# Patient Record
Sex: Female | Born: 1935 | Race: White | Hispanic: No | Marital: Married | Smoking: Former smoker
Health system: Southern US, Community
[De-identification: ages and names within clinical notes are randomized; demographics above are authoritative.]

## PROBLEM LIST (undated history)

## (undated) DIAGNOSIS — I493 Ventricular premature depolarization: Secondary | ICD-10-CM

## (undated) DIAGNOSIS — R918 Other nonspecific abnormal finding of lung field: Secondary | ICD-10-CM

## (undated) DIAGNOSIS — F329 Major depressive disorder, single episode, unspecified: Secondary | ICD-10-CM

## (undated) DIAGNOSIS — I519 Heart disease, unspecified: Secondary | ICD-10-CM

## (undated) DIAGNOSIS — I482 Chronic atrial fibrillation, unspecified: Secondary | ICD-10-CM

## (undated) DIAGNOSIS — F32A Depression, unspecified: Secondary | ICD-10-CM

## (undated) DIAGNOSIS — K219 Gastro-esophageal reflux disease without esophagitis: Secondary | ICD-10-CM

## (undated) DIAGNOSIS — G459 Transient cerebral ischemic attack, unspecified: Secondary | ICD-10-CM

## (undated) DIAGNOSIS — I429 Cardiomyopathy, unspecified: Secondary | ICD-10-CM

## (undated) DIAGNOSIS — I340 Nonrheumatic mitral (valve) insufficiency: Secondary | ICD-10-CM

## (undated) HISTORY — DX: Chronic atrial fibrillation, unspecified: I48.20

## (undated) HISTORY — DX: Nonrheumatic mitral (valve) insufficiency: I34.0

## (undated) HISTORY — DX: Ventricular premature depolarization: I49.3

## (undated) HISTORY — PX: INTRAOCULAR LENS IMPLANT, SECONDARY: SHX1842

## (undated) HISTORY — DX: Heart disease, unspecified: I51.9

## (undated) HISTORY — DX: Cardiomyopathy, unspecified: I42.9

## (undated) HISTORY — DX: Gastro-esophageal reflux disease without esophagitis: K21.9

## (undated) HISTORY — DX: Depression, unspecified: F32.A

## (undated) HISTORY — DX: Major depressive disorder, single episode, unspecified: F32.9

## (undated) HISTORY — DX: Other nonspecific abnormal finding of lung field: R91.8

## (undated) HISTORY — PX: ABDOMINAL HYSTERECTOMY: SHX81

## (undated) HISTORY — PX: KNEE SURGERY: SHX244

## (undated) HISTORY — PX: APPENDECTOMY: SHX54

---

## 2006-12-20 ENCOUNTER — Encounter: Payer: Self-pay | Admitting: Pulmonary Disease

## 2007-11-11 ENCOUNTER — Encounter: Payer: Self-pay | Admitting: Pulmonary Disease

## 2007-11-29 ENCOUNTER — Ambulatory Visit: Payer: Self-pay | Admitting: Pulmonary Disease

## 2007-11-29 DIAGNOSIS — R05 Cough: Secondary | ICD-10-CM

## 2007-11-29 DIAGNOSIS — I509 Heart failure, unspecified: Secondary | ICD-10-CM | POA: Insufficient documentation

## 2007-12-13 ENCOUNTER — Encounter: Payer: Self-pay | Admitting: Pulmonary Disease

## 2007-12-13 ENCOUNTER — Ambulatory Visit (HOSPITAL_COMMUNITY): Admission: RE | Admit: 2007-12-13 | Discharge: 2007-12-13 | Payer: Self-pay | Admitting: Pulmonary Disease

## 2007-12-20 ENCOUNTER — Ambulatory Visit: Payer: Self-pay | Admitting: Pulmonary Disease

## 2007-12-20 DIAGNOSIS — K219 Gastro-esophageal reflux disease without esophagitis: Secondary | ICD-10-CM

## 2008-01-05 ENCOUNTER — Ambulatory Visit: Payer: Self-pay | Admitting: Internal Medicine

## 2008-01-18 ENCOUNTER — Ambulatory Visit (HOSPITAL_COMMUNITY): Admission: RE | Admit: 2008-01-18 | Discharge: 2008-01-18 | Payer: Self-pay | Admitting: Internal Medicine

## 2008-01-18 ENCOUNTER — Ambulatory Visit: Payer: Self-pay | Admitting: Internal Medicine

## 2008-01-20 ENCOUNTER — Telehealth (INDEPENDENT_AMBULATORY_CARE_PROVIDER_SITE_OTHER): Payer: Self-pay | Admitting: *Deleted

## 2008-04-17 ENCOUNTER — Ambulatory Visit: Payer: Self-pay | Admitting: Gastroenterology

## 2008-08-09 ENCOUNTER — Telehealth (INDEPENDENT_AMBULATORY_CARE_PROVIDER_SITE_OTHER): Payer: Self-pay

## 2008-10-11 ENCOUNTER — Telehealth (INDEPENDENT_AMBULATORY_CARE_PROVIDER_SITE_OTHER): Payer: Self-pay | Admitting: *Deleted

## 2008-11-26 ENCOUNTER — Telehealth (INDEPENDENT_AMBULATORY_CARE_PROVIDER_SITE_OTHER): Payer: Self-pay | Admitting: *Deleted

## 2009-01-28 ENCOUNTER — Encounter (INDEPENDENT_AMBULATORY_CARE_PROVIDER_SITE_OTHER): Payer: Self-pay

## 2010-02-24 ENCOUNTER — Ambulatory Visit: Payer: Self-pay | Admitting: Cardiology

## 2010-02-24 ENCOUNTER — Ambulatory Visit (HOSPITAL_COMMUNITY): Admission: RE | Admit: 2010-02-24 | Discharge: 2010-02-24 | Payer: Self-pay | Admitting: Cardiology

## 2010-05-19 ENCOUNTER — Ambulatory Visit: Payer: Self-pay | Admitting: Cardiology

## 2010-10-28 NOTE — Assessment & Plan Note (Signed)
Casey White, Casey White                  CHART#:  [id number]   DATE:  04/17/2008                       DOB:  January 06, 1928   CHIEF COMPLAINT:  Followup for cough and reflux.   SUBJECTIVE:  The patient is a 77 year old lady who has a history of  typical reflux symptoms with heartburn and regurgitation.  She was  started on Nexium for this.  These symptoms are well controlled.  She  has also history of coughing fits that are particularly worse  nocturnally when she lies down.  At time, she wakes up and feels like  she is choking to death.  This also does occur at times with meals.  She  has had extensive evaluation with a barium pill esophagram, which did  show marked reflux to the cervical, thoracic, and esophagus when she was  supine.  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  A 13-mm barium tablet was passed without  any difficulty.  She also had a speech therapy evaluation.  According to  the patient, she did not have any signs or a risk of aspiration.  She  had an EGD by Dr. Jena Gauss, which showed a single distal esophageal  erosion, patulous EG junction, and small hiatal hernia.  She has been on  Nexium 40 mg b.i.d. now for about 3 months.  She has not noticed any  improvement in her cough and she states it is actually probably worse.  She continued to have some intermittent hoarseness.  She does have a  history of CHF.  She states that Dr. Peter [place], her cardiologist does  not feel that her symptoms are related to this.  She has already been  evaluated by pulmonologist, Dr. Shelle Iron who felt her symptoms were due to  reflux.  She is already sleeping on 3 pillows.  She plans to get a  pillow wedge to try to elevate her more completely.  She is no longer on  metoclopramide that she saw the risk on TV with tardive dyskinesia and  decided to stop it.  She really did not notice any improvement on it  anyhow.   CURRENT MEDICATIONS:  See updated  list.   ALLERGIES:  Macrobid, sulfa, Demerol, codeine, Cipro, and morphine.   PHYSICAL EXAMINATION:  VITAL SIGNS:  Weight 136, down 10 pounds,  temperature 97.7, blood pressure 100/68, and pulse 60.  GENERAL:  Pleasant, well-nourished, and well-developed elderly Caucasian  female, in no acute distress.  SKIN:  Warm and dry.  No jaundice.  HEENT:  Sclerae nonicteric.  Oropharyngeal mucosa moist and pink.  No  lesions, erythema, or exudate.  No lymphadenopathy or thyromegaly.  ABDOMEN:  Soft and nontender.  LOWER EXTREMITIES:  No edema.   IMPRESSION:  The patient is a very pleasant 77 year old lady with  history of chronic coughing spells, which occur primarily nocturnally,  but also throughout the day and sometimes associated with meals.  She  has had a complete evaluation, which revealed marked gastroesophageal  reflux into the cervical, thoracic, and esophagus in the supine position  on a barium esophagram.  She reportedly had an unremarkable speech  therapy evaluation.  EGD findings revealed a single distal esophageal  erosion.  She had a small hiatal hernia.  She has been on double dose  Nexium for over 3 months with no improvement of her cough.  Her typical  reflux symptoms are now completely controlled.  It is unlikely that her  worsening chronic cough is due to acid reflux; however, cannot exclude  other reflux (ie alkaline).  There is an outside chance that her  symptoms are due to laryngopharyngeal reflux, however, I believe other  options do need to be entertained.   PLAN:  I have discussed with the patient and her daughter extensively  today that at this point, I feel that her acid reflux ought to be  adequately controlled on double dose PPI.  We will give her a try with  one other medication prior to any further workup, however.  We will  start on Kapidex 60 mg daily for the next 2 weeks and if she has noticed  no improvement at that time, she will increase to b.i.d.  She  will call  me in few weeks with the progress report and if she continues to have  ongoing cough, we would like for her to go back and see Dr. Shelle Iron prior  to any further workup.  She may ultimately need to see an ENT to verify  that there is nothing in the oropharynx that is stimulating her cough.  An impedence/pH study also may be beneficial.  I have also noticed that  she has dropped about 10 pounds as we initially saw her 5 months ago.  If she continues to have ongoing weight loss, this will need to be  further evaluated as well.       Tana Coast, P.A.  Electronically Signed     R. Roetta Sessions, M.D.  Electronically Signed    LL/MEDQ  D:  04/17/2008  T:  04/17/2008  Job:  [id number]   cc:   Doreen Beam, MD  Barbaraann Share, MD,FCCP

## 2010-10-28 NOTE — Op Note (Signed)
NAME:  Casey White, Casey White                 ACCOUNT NO.:  [id number]   MEDICAL RECORD NO.:  [id number]          PATIENT TYPE:  AMB   LOCATION:  DAY                           FACILITY:  [facility]   PHYSICIAN:  R. Roetta Sessions, M.D. DATE OF BIRTH:  [birth year]   DATE OF PROCEDURE:  01/18/2008  DATE OF DISCHARGE:                               OPERATIVE REPORT   PROCEDURE:  Diagnostic esophagogastroduodenoscopy.   INDICATIONS FOR PROCEDURE:  A 77 year old lady with recent worsening  gastroesophageal reflux disease, refractory symptoms, on Nexium 40 mg  orally daily.  We saw her in office recently and bumped her Nexium up to  40 mg twice daily, and she tells me since that time her reflux symptoms  have improved dramatically.  She is not having any odynophagia or  dysphagia.  She is anticoagulated and Coumadin therapy is ongoing.  EGD  is now being done as a diagnostic-only only study.  Risks, benefits,  alternatives, limitations previously reviewed and again today at the  bedside, questions answered, all parties agreeable.   PROCEDURE NOTE:  O2 saturation, blood pressure, pulse, and respirations  monitored throughout the entire procedure.   CONSCIOUS SEDATION:  Fentanyl 50 mcg IV and Versed 3 mg IV in divided  doses.   INSTRUMENT:  Pentax video chip system.   FINDINGS:  Examination of tubular esophagus revealed a single 6-7-mm  distal esophageal erosion.  EG junction was patulous, easily traversed,  esophageal mucosa otherwise appeared normal.  Stomach:  Gastric cavity  was emptied and insufflated well with air.  Thorough examination of the  gastric mucosa including retroflexion in the proximal stomach and  esophagogastric junction demonstrated hiatal hernia and some benign-  appearing nodular folds of the antrum, pylorus patent, easily traversed.  Examination of the bulb, second portion revealed no abnormalities.  Therapeutic/diagnostic maneuvers performed, none.   The patient tolerated the  procedure well and was reactive to endoscopy.   IMPRESSION:  A single distal esophageal erosion consistent with erosive  reflux esophagitis, patulous esophagogastric junction, small hiatal  hernia, nodular benign-appearing folds of the antrum, otherwise  unremarkable stomach, patent pylorus, and normal D1and D2.   RECOMMENDATIONS:  Continue Nexium 40 mg orally twice daily, prescription  given.  She is urged to go by my office to get some additional samples  to help her out.  Plan to see this nice lady back in the office in 3  months.      Jonathon Bellows, M.D.  Electronically Signed     RMR/MEDQ  D:  01/18/2008  T:  01/19/2008  Job:  [id number]   cc:   Barbaraann Share, MD,FCCP  [address]  [place]  [place] [address]

## 2010-10-28 NOTE — Consult Note (Signed)
NAME:  Casey White, Casey White                 ACCOUNT NO.:  [id number]   MEDICAL RECORD NO.:  [id number]          PATIENT TYPE:  AMB   LOCATION:  DAY                           FACILITY:  [facility]   PHYSICIAN:  R. Roetta Sessions, M.D. DATE OF BIRTH:  [birth year]   DATE OF CONSULTATION:  DATE OF DISCHARGE:                                 CONSULTATION   CHIEF COMPLAINT:  Coughing fits and refractory GERD.   HISTORY OF PRESENT ILLNESS:  Casey White is a 77 year old female.  She  tells me she has been having coughing fits that awaken her at night.  She has been having these symptoms for about 1 year now.  She is  sleeping on 3 pillows as well as a rolled towel.  She has been started  on Nexium 40 mg daily and this was recently increased to b.i.d. about 3  weeks ago.  It does seem to be helping significantly on the b.i.d.  dosing.  She was started on Reglan 10 mg b.i.d. as well about 2 weeks  ago.  She does have heartburn and indigestion prior to taking her  Nexium.  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  She denies any dysphagia.  Denies any  odynophagia.  She denies any nausea or vomiting.  She denies any lower  abdominal pain.  She denies any rectal bleeding or melena.  She has lost  a few pounds, which has been intentional.   PAST MEDICAL AND SURGICAL HISTORY:  She has a history of colonic polyps,  history of atrial fibrillation, has been on Coumadin for about 4 years  now.  She also has congestive heart failure.  She had a CVA in 2001.  She has history of GERD.  She had a colonoscopy about 2 years ago,  believes she had a polyp at that time.  This was done by Dr. Cleotis Nipper  in  [place], [place].  She has history of osteoporosis.  She has had  right foot and ankle surgery.  She is status post hysterectomy, bladder  tack, and left knee surgery.   CURRENT MEDICATIONS:  Nexium 40 mg b.i.d.; metoclopramide 10 mg b.i.d.;  furosemide 40 mg b.i.d.; benzonatate 100 mg 1-2 t.i.d. p.r.n.;  cetirizine nightly; and Coumadin 5 mg on Friday, Saturday, Sunday, and  Monday and 2.5 mg on Tuesday, Wednesday, and Thursday.   ALLERGIES:  MACROBID, SULFA, DEMEROL, CODEINE, CIPRO, and MORPHINE.   Family history is positive for a brother and a father both with colon  cancer diagnosed in their 10s.  Mother deceased at 79 secondary to old  age.  She has lost a sister to ovarian cancer and a brother to lung  cancer and colon cancer.  History is also significant for prostate  cancer, COPD, MI, and diabetes mellitus.  She does have 2 living  brothers.  Total of seven siblings deceased.   SOCIAL HISTORY:  Ms. Nitsch is married.  She has one healthy daughter.  She recently lost her another daughter to suicide approximately 9 months  ago.  She is retired from [employer].  She has a remote history of  tobacco use, quit 40 years ago.  Denies any alcohol or drug use.   REVIEW OF SYSTEMS:  CONSTITUTIONAL:  She is having fatigue and weakness,  otherwise negative review of systems, see HPI.   PHYSICAL EXAM:  VITAL SIGNS:  Weight 146 pounds, height of 66 inches,  temp 97.6, blood pressure 100/80, and pulse 72.  GENERAL:  Casey White is an elderly Caucasian female who is well developed  and well nourished in no acute distress.  HEENT.  Sclerae nonicteric.  Conjunctivae pink.  Oropharynx pink and  moist without any lesions.  NECK:  Supple without mass or thyromegaly.  CHEST:  Heart rate is irregularly irregular.  LUNGS:  Clear to auscultation bilaterally.  ABDOMEN:  Flat with positive bowel sounds x4.  There is no bruits  auscultated.  Abdomen is soft, nontender, and nondistended without   palpable mass or hepatosplenomegaly.  No rebound, tenderness, or  guarding.  EXTREMITIES:  With 1+ ankle edema bilaterally.  SKIN:  Pink, warm, and dry without any rash or jaundice.   IMPRESSION:  Casey White is a 77 year old female with a 1-year history of  refractory gastroesophageal reflux disease.  She continues to have  breakthrough symptoms despite 40 mg of Nexium b.i.d. and Reglan b.i.d.  Her symptoms are suggestive of a LPR with a chronic episodic cough and  nocturnal symptoms.  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  There is no history of  dysphagia.  Given the onset of the symptoms at her age, I do feel she  requires further evaluation with EGD.   She has a history of colonic polyps and strong family history of colon  cancer.  She believes she may be due for surveillance colonoscopy;  however, she is not wanting to pursue this at this time.  She tells me  she had significant nausea and vomiting with the prep.  I have offered  antiemetics, but ultimately she is not wanting to have any further  colonoscopies given her age.  I described this as a relatively low-risk  procedure; however, we will review the records and make further  suggestions.   PLAN:  1. EGD with Dr. Jena Gauss in the near future.  I have discussed the      procedure including risks and benefits, which include but are not      limited to bleeding, infection, perforation, and drug reaction.      Risk of bleeding is increased given the fact that she is on      Coumadin.  I also explained that if she needs manipulation or      biopsies, she may have to have a second EGD for this and she agrees      with this plan.  She and her daughter agree with this plan.  2. Nexium 40 mg b.i.d.  Prescription was given for #60 with 5 refills.      She cannot take omeprazole secondary to potential drug interaction      with Coumadin.  3. I have given her 2-week supply of  samples of Nexium.  4. We will request colonoscopy report from  Dr. Cleotis Nipper.   Thank you Dr. Shelle Iron for allowing [place] to participate in the care of Ms.  White.      Lorenza Burton, N.P.      Jonathon Bellows, M.D.  Electronically Signed    KJ/MEDQ  D:  01/05/2008  T:  01/06/2008  Job:  [id number]   cc:   Barbaraann Share, MD,FCCP  [address]  [place]  [place] [address]

## 2010-11-21 ENCOUNTER — Encounter: Payer: Self-pay | Admitting: Cardiology

## 2010-11-21 ENCOUNTER — Other Ambulatory Visit: Payer: Self-pay | Admitting: *Deleted

## 2010-11-21 DIAGNOSIS — R0602 Shortness of breath: Secondary | ICD-10-CM

## 2010-11-24 ENCOUNTER — Other Ambulatory Visit (INDEPENDENT_AMBULATORY_CARE_PROVIDER_SITE_OTHER): Payer: Medicare Other | Admitting: *Deleted

## 2010-11-24 ENCOUNTER — Encounter: Payer: Self-pay | Admitting: Cardiology

## 2010-11-24 ENCOUNTER — Ambulatory Visit (INDEPENDENT_AMBULATORY_CARE_PROVIDER_SITE_OTHER): Payer: Medicare Other | Admitting: Cardiology

## 2010-11-24 VITALS — BP 110/70 | HR 76 | Ht 63.5 in | Wt 139.4 lb

## 2010-11-24 DIAGNOSIS — I509 Heart failure, unspecified: Secondary | ICD-10-CM

## 2010-11-24 DIAGNOSIS — R0602 Shortness of breath: Secondary | ICD-10-CM

## 2010-11-24 DIAGNOSIS — I482 Chronic atrial fibrillation, unspecified: Secondary | ICD-10-CM

## 2010-11-24 DIAGNOSIS — I4891 Unspecified atrial fibrillation: Secondary | ICD-10-CM

## 2010-11-24 LAB — BRAIN NATRIURETIC PEPTIDE: Pro B Natriuretic peptide (BNP): 401 pg/mL — ABNORMAL HIGH (ref 0.0–100.0)

## 2010-11-24 LAB — BASIC METABOLIC PANEL
CO2: 29 mEq/L (ref 19–32)
Calcium: 9.1 mg/dL (ref 8.4–10.5)
Creatinine, Ser: 0.9 mg/dL (ref 0.4–1.2)

## 2010-11-24 MED ORDER — METOPROLOL SUCCINATE ER 50 MG PO TB24: 50.0000 mg | ORAL_TABLET | Freq: Every day | ORAL | Status: DC

## 2010-11-24 NOTE — Assessment & Plan Note (Signed)
Rate is well controlled on current medications. She is on chronic anticoagulation with Coumadin. We will continue with her current medical therapy.

## 2010-11-24 NOTE — Assessment & Plan Note (Addendum)
She is well compensated on her current diuretic therapy. I recommended continuing on her same therapy. She is down 7 pounds from her last visit. We will continue her on metoprolol and Lasix. I've instructed her in sodium restriction. We will check a basic metabolic panel and BNP level today. I will follow up again in 6 months.

## 2010-11-24 NOTE — Patient Instructions (Signed)
We will call you with the results of your blood work today.  Avoid salt intake.  We will see you again in 6 months.

## 2010-11-24 NOTE — Progress Notes (Signed)
   Casey White Date of Birth: 12-Jan-1928   History of Present Illness: Casey White is seen today for followup. She states she has been doing well. She denies any chest pain or shortness of breath. She does complain of feeling weak. She's had no significant headache, dizziness, or syncope. She does report that her husband who is 85 and is in failing health. She has had no edema or orthopnea.  Current Outpatient Prescriptions on File Prior to Visit  Medication Sig Dispense Refill  . ALPRAZolam (XANAX) 0.25 MG tablet Take 0.25 mg by mouth at bedtime as needed.        . calcium carbonate (OS-CAL - DOSED IN MG OF ELEMENTAL CALCIUM) 1250 MG tablet Take 1 tablet by mouth daily.        . furosemide (LASIX) 20 MG tablet Take 60 mg by mouth 2 (two) times daily.        . mirtazapine (REMERON) 15 MG tablet Take 7.5 mg by mouth at bedtime.       . traMADol-acetaminophen (ULTRACET) 37.5-325 MG per tablet Take 1 tablet by mouth every 6 (six) hours as needed.        . warfarin (COUMADIN) 5 MG tablet Take 5 mg by mouth as directed.        Marland Kitchen DISCONTD: metoprolol (TOPROL-XL) 50 MG 24 hr tablet Take 50 mg by mouth daily.          Allergies  Allergen Reactions  . Ciprofloxacin   . Codeine   . Meperidine Hcl   . Morphine   . Nitrofurantoin   . Sulfonamide Derivatives     Past Medical History  Diagnosis Date  . CHF (congestive heart failure)     WITH MODERATE SYSTOLIC DYSFUNCTION. EF 35-45%  . Chronic atrial fibrillation   . Chronic anticoagulation   . GERD (gastroesophageal reflux disease)   . Depression   . PVCs (premature ventricular contractions)     Past Surgical History  Procedure Date  . Appendectomy   . Knee surgery     LEFT KNEE  . Abdominal hysterectomy   . Intraocular lens implant, secondary     bilateral    History  Smoking status  . Former Smoker  . Quit date: 11/20/1965  Smokeless tobacco  . Not on file    History  Alcohol Use No    Family History  Problem  Relation Age of Onset  . Colon cancer Father     Review of Systems: As noted in history of present illness.  All other systems were reviewed and are negative.  Physical Exam: BP 110/70  Pulse 76  Ht 5' 3.5" (1.613 m)  Wt 139 lb 6.4 oz (63.231 kg)  BMI 24.31 kg/m2 She is a pleasant elderly white female in no acute distress. Her HEENT exam is unremarkable. She has no JVD or bruits. Lungs are clear. Cardiac exam reveals an irregular rate and rhythm without gallop, murmur, or click. Abdomen is soft and nontender without masses or bruits. Extremities are without edema. Pedal pulses are 2+ and symmetric. LABORATORY DATA:   Assessment / Plan:

## 2010-11-25 ENCOUNTER — Telehealth: Payer: Self-pay | Admitting: *Deleted

## 2010-11-25 NOTE — Telephone Encounter (Signed)
Message copied by Lorayne Bender on Tue Nov 25, 2010 11:28 AM ------      Message from: [place], PETER M      Created: Mon Nov 24, 2010  3:05 PM       Bmet is normal. BNP has increased a little but is stable. Continue current diuretic Rx.

## 2010-11-25 NOTE — Telephone Encounter (Signed)
Notified of lab results. Will send copy to Dr. Sherril Croon

## 2011-05-14 ENCOUNTER — Telehealth: Payer: Self-pay | Admitting: Cardiology

## 2011-05-14 NOTE — Telephone Encounter (Signed)
Error

## 2011-05-21 ENCOUNTER — Telehealth: Payer: Self-pay | Admitting: *Deleted

## 2011-05-21 NOTE — Telephone Encounter (Signed)
Error

## 2011-05-28 ENCOUNTER — Ambulatory Visit: Payer: PRIVATE HEALTH INSURANCE | Admitting: Cardiology

## 2011-06-18 ENCOUNTER — Encounter: Payer: Self-pay | Admitting: Cardiology

## 2011-06-18 ENCOUNTER — Ambulatory Visit (INDEPENDENT_AMBULATORY_CARE_PROVIDER_SITE_OTHER): Payer: Medicare Other | Admitting: Cardiology

## 2011-06-18 VITALS — BP 112/80 | HR 74 | Ht 67.0 in | Wt 137.8 lb

## 2011-06-18 DIAGNOSIS — I509 Heart failure, unspecified: Secondary | ICD-10-CM

## 2011-06-18 DIAGNOSIS — I482 Chronic atrial fibrillation, unspecified: Secondary | ICD-10-CM

## 2011-06-18 DIAGNOSIS — I4891 Unspecified atrial fibrillation: Secondary | ICD-10-CM

## 2011-06-18 NOTE — Progress Notes (Signed)
   Casey White Date of Birth: Jun 18, 1927   History of Present Illness: Casey White is seen today for 6 month followup. She states she has been doing well. She denies any chest pain. Sometimes at night she gets short of breath if she lies on her right side. It resolves if she rolls to her left side. She has noted some mild edema at times. She denies any shortness of breath with exertion.  Current Outpatient Prescriptions on File Prior to Visit  Medication Sig Dispense Refill  . ALPRAZolam (XANAX) 0.25 MG tablet Take 0.25 mg by mouth at bedtime as needed.        . calcium carbonate (OS-CAL - DOSED IN MG OF ELEMENTAL CALCIUM) 1250 MG tablet Take 1 tablet by mouth daily.        . furosemide (LASIX) 20 MG tablet Take 60 mg by mouth 2 (two) times daily.        . metoprolol (TOPROL-XL) 50 MG 24 hr tablet Take 1 tablet (50 mg total) by mouth daily.  90 tablet  3  . mirtazapine (REMERON) 15 MG tablet Take 7.5 mg by mouth at bedtime.       . traMADol-acetaminophen (ULTRACET) 37.5-325 MG per tablet Take 1 tablet by mouth every 6 (six) hours as needed.        . warfarin (COUMADIN) 5 MG tablet Take 5 mg by mouth as directed.          Allergies  Allergen Reactions  . Angiotensin Receptor Blockers Other (See Comments)    hypotension  . Ciprofloxacin   . Codeine   . Lisinopril Cough  . Meperidine Hcl   . Morphine   . Nitrofurantoin   . Sulfonamide Derivatives     Past Medical History  Diagnosis Date  . CHF (congestive heart failure)     WITH MODERATE SYSTOLIC DYSFUNCTION. EF 35-45%  . Chronic atrial fibrillation   . Chronic anticoagulation   . GERD (gastroesophageal reflux disease)   . Depression   . PVCs (premature ventricular contractions)     Past Surgical History  Procedure Date  . Appendectomy   . Knee surgery     LEFT KNEE  . Abdominal hysterectomy   . Intraocular lens implant, secondary     bilateral    History  Smoking status  . Former Smoker  . Quit date: 11/20/1965    Smokeless tobacco  . Not on file    History  Alcohol Use No    Family History  Problem Relation Age of Onset  . Colon cancer Father     Review of Systems: As noted in history of present illness.  All other systems were reviewed and are negative.  Physical Exam: BP 112/80  Pulse 74  Ht 5\' 7"  (1.702 m)  Wt 137 lb 12.8 oz (62.506 kg)  BMI 21.58 kg/m2 She is a pleasant elderly white female in no acute distress. Her HEENT exam is unremarkable. She has no JVD or bruits. Lungs are clear. Cardiac exam reveals an irregular rate and rhythm without gallop, murmur, or click. Abdomen is soft and nontender without masses or bruits. Extremities are without edema. Pedal pulses are 2+ and symmetric. LABORATORY DATA: ECG today demonstrates atrial fibrillation with controlled ventricular response of 74 beats per minute. She has occasional PVCs. There is a nonspecific ST abnormality and mildly prolonged QT interval.  Assessment / Plan:

## 2011-06-18 NOTE — Assessment & Plan Note (Signed)
Her heart rate is well controlled on beta blocker therapy. She is on chronic anticoagulation with Coumadin. This is monitored by Dr. Sherril Croon.

## 2011-06-18 NOTE — Progress Notes (Signed)
Addended by: Erin Hearing on: 06/18/2011 04:35 PM   Modules accepted: Orders

## 2011-06-18 NOTE — Patient Instructions (Signed)
Continue your current medications  I will see you again in 6 months.   

## 2011-06-18 NOTE — Assessment & Plan Note (Signed)
She has a history of moderate left ventricular dysfunction. She appears to be well compensated on exam today. She is on diuretic therapy and a beta blocker. She has been intolerant of ACE inhibitors in the past because of cough. ARB use of resulted in significant hypotension. We will continue with her current therapy. I've reinforced the need for sodium restriction. I'll followup again in 6 months.

## 2011-07-03 NOTE — Progress Notes (Signed)
Addended by: Judithe Modest D on: 07/03/2011 01:56 PM   Modules accepted: Orders

## 2012-01-11 ENCOUNTER — Ambulatory Visit: Payer: Medicare Other | Admitting: Cardiology

## 2012-01-24 ENCOUNTER — Encounter (HOSPITAL_COMMUNITY): Payer: Self-pay | Admitting: Internal Medicine

## 2012-01-24 ENCOUNTER — Inpatient Hospital Stay (HOSPITAL_COMMUNITY)
Admission: EM | Admit: 2012-01-24 | Discharge: 2012-01-27 | DRG: 066 | Disposition: A | Discharge status: Skilled Nursing Facility | Payer: Medicare Other | Attending: Internal Medicine | Admitting: Internal Medicine

## 2012-01-24 ENCOUNTER — Emergency Department (HOSPITAL_COMMUNITY): Payer: Medicare Other

## 2012-01-24 DIAGNOSIS — R9389 Abnormal findings on diagnostic imaging of other specified body structures: Secondary | ICD-10-CM

## 2012-01-24 DIAGNOSIS — E119 Type 2 diabetes mellitus without complications: Secondary | ICD-10-CM | POA: Diagnosis present

## 2012-01-24 DIAGNOSIS — Z7901 Long term (current) use of anticoagulants: Secondary | ICD-10-CM

## 2012-01-24 DIAGNOSIS — I4891 Unspecified atrial fibrillation: Secondary | ICD-10-CM | POA: Diagnosis present

## 2012-01-24 DIAGNOSIS — Z8673 Personal history of transient ischemic attack (TIA), and cerebral infarction without residual deficits: Secondary | ICD-10-CM

## 2012-01-24 DIAGNOSIS — I6340 Cerebral infarction due to embolism of unspecified cerebral artery: Secondary | ICD-10-CM

## 2012-01-24 DIAGNOSIS — I6350 Cerebral infarction due to unspecified occlusion or stenosis of unspecified cerebral artery: Principal | ICD-10-CM | POA: Diagnosis present

## 2012-01-24 DIAGNOSIS — I639 Cerebral infarction, unspecified: Secondary | ICD-10-CM

## 2012-01-24 DIAGNOSIS — R4701 Aphasia: Secondary | ICD-10-CM | POA: Diagnosis present

## 2012-01-24 DIAGNOSIS — R4789 Other speech disturbances: Secondary | ICD-10-CM | POA: Diagnosis present

## 2012-01-24 DIAGNOSIS — E876 Hypokalemia: Secondary | ICD-10-CM | POA: Diagnosis present

## 2012-01-24 HISTORY — DX: Transient cerebral ischemic attack, unspecified: G45.9

## 2012-01-24 LAB — POCT I-STAT, CHEM 8
Calcium, Ion: 1.04 mmol/L — ABNORMAL LOW (ref 1.13–1.30)
Chloride: 101 mEq/L (ref 96–112)
Creatinine, Ser: 1.2 mg/dL — ABNORMAL HIGH (ref 0.50–1.10)
Glucose, Bld: 107 mg/dL — ABNORMAL HIGH (ref 70–99)
HCT: 44 % (ref 36.0–46.0)
Hemoglobin: 15 g/dL (ref 12.0–15.0)

## 2012-01-24 LAB — COMPREHENSIVE METABOLIC PANEL
Albumin: 3.6 g/dL (ref 3.5–5.2)
BUN: 18 mg/dL (ref 6–23)
Creatinine, Ser: 1 mg/dL (ref 0.50–1.10)
GFR calc Af Amer: 55 mL/min — ABNORMAL LOW (ref >90)
Total Protein: 7.7 g/dL (ref 6.0–8.3)

## 2012-01-24 LAB — CBC
HCT: 42.3 % (ref 36.0–46.0)
Hemoglobin: 13.7 g/dL (ref 12.0–15.0)
MCH: 27.4 pg (ref 26.0–34.0)
MCHC: 32.4 g/dL (ref 30.0–36.0)
MCV: 84.6 fL (ref 78.0–100.0)

## 2012-01-24 LAB — URINALYSIS, ROUTINE W REFLEX MICROSCOPIC
Bilirubin Urine: NEGATIVE
Ketones, ur: NEGATIVE mg/dL
Nitrite: NEGATIVE
pH: 7 (ref 5.0–8.0)

## 2012-01-24 LAB — DIFFERENTIAL
Basophils Relative: 1 % (ref 0–1)
Eosinophils Absolute: 0.3 10*3/uL (ref 0.0–0.7)
Eosinophils Relative: 2 % (ref 0–5)
Monocytes Absolute: 0.9 10*3/uL (ref 0.1–1.0)
Monocytes Relative: 8 % (ref 3–12)

## 2012-01-24 LAB — CK TOTAL AND CKMB (NOT AT ARMC)
CK, MB: 2.1 ng/mL (ref 0.3–4.0)
Total CK: 105 U/L (ref 7–177)

## 2012-01-24 LAB — GLUCOSE, CAPILLARY: Glucose-Capillary: 107 mg/dL — ABNORMAL HIGH (ref 70–99)

## 2012-01-24 LAB — URINE MICROSCOPIC-ADD ON

## 2012-01-24 LAB — TROPONIN I: Troponin I: <0.3 ng/mL (ref <0.30)

## 2012-01-24 LAB — PROTIME-INR: INR: 2.21 — ABNORMAL HIGH (ref 0.00–1.49)

## 2012-01-24 NOTE — Code Documentation (Addendum)
78 yo wf brought in via [place] [facility] with acute onset aphasia.  Pt drove herself this afternoon to the nursing home to visit her husband and had a witnessed acute onset on aphasia.  Pt has a hx of A. Fib &takes coumadin per her family. Code stroke called 1945, pt arrival 14, LSN 76, pt arrival in CT 1952, CT read 2003. [facility] 4 unable to answer questions & expressive aphasia.  Code stroke cancelled at 2118 by Dr. Roseanne Reno. Pt not a tPA candidate.

## 2012-01-24 NOTE — Code Documentation (Signed)
The pt has h  A history of af and small strokes in the past according to the granddaughter who remains at the bedside.Marland Kitchen

## 2012-01-24 NOTE — ED Notes (Signed)
Escorted family member back to see patient; RN aware. 

## 2012-01-24 NOTE — Code Documentation (Signed)
The pt is conversing with her granddaughter.  The pts speech and thought process is clear at present.

## 2012-01-24 NOTE — ED Provider Notes (Signed)
I saw and evaluated the patient, reviewed the resident's note and I agree with the findings and plan.  Acute onset of expressive aphasia, slow to follow commands, no apparent motor deficits. Not tPA candidate based on improving symptoms, age, history of coumadin use.  Glynn Octave, MD 01/24/12 [phone]

## 2012-01-24 NOTE — ED Notes (Signed)
The pt passed her swallow screen family at the bedside

## 2012-01-24 NOTE — Code Documentation (Signed)
Family at beside. Family given emotional support. 

## 2012-01-24 NOTE — Code Documentation (Signed)
The pt placed on the bedpan unable to void.  Asking for water.  Swallow screen not performed just yet

## 2012-01-24 NOTE — H&P (Signed)
Casey White is an 78 y.o. female.   Patient was seen and examined on January 24, 2012 at 10:45 PM. PCP - Dr. Sherril Croon. Chief Complaint: Difficulty bringing out words. HPI: 78 year-old female with history of atrial fibrillation on Coumadin and previous history of TIA had gone to see her husband had nursing home at around 6:45 PM when she suddenly developed speech difficulties when she was found to have difficulty breathing out words as witnessed by patient's daughter who was by her side. She also felt dizzy but did not lose consciousness. She was brought to the ER. CT head was showing subacute to chronic basal ganglia infarct. Neurologist on-call Dr. Roseanne Reno had already seen the patient and at this time feels patient is not a candidate for TPA as patient is on a Coumadin which is therapeutic. Patient will be admitted for further workup. Patient still has mild expressive aphasia. Denies any chest pain shortness of breath palpitation nausea vomiting headache or visual symptoms.  Past Medical History  Diagnosis Date  . TIA (transient ischemic attack)   . Dysrhythmia     Past Surgical History  Procedure Date  . Abdominal hysterectomy   . Appendectomy     History reviewed. No pertinent family history. Social History:  reports that she has never smoked. She does not have any smokeless tobacco history on file. She reports that she does not drink alcohol or use illicit drugs.  Allergies:  Allergies  Allergen Reactions  . Amoxicillin Other (See Comments)    unknown  . Codeine Nausea And Vomiting  . Demerol (Meperidine) Nausea And Vomiting     (Not in a hospital admission)  Results for orders placed during the hospital encounter of 01/24/12 (from the past 48 hour(s))  PROTIME-INR     Status: Abnormal   Collection Time   01/24/12  8:04 PM      Component Value Range Comment   Prothrombin Time 24.9 (*) 11.6 - 15.2 seconds    INR 2.21 (*) 0.00 - 1.49   APTT     Status: Abnormal   Collection Time     01/24/12  8:04 PM      Component Value Range Comment   aPTT 39 (*) 24 - 37 seconds   CBC     Status: Abnormal   Collection Time   01/24/12  8:04 PM      Component Value Range Comment   WBC 12.1 (*) 4.0 - 10.5 K/uL    RBC 5.00  3.87 - 5.11 MIL/uL    Hemoglobin 13.7  12.0 - 15.0 g/dL    HCT 16.1  09.6 - 04.5 %    MCV 84.6  78.0 - 100.0 fL    MCH 27.4  26.0 - 34.0 pg    MCHC 32.4  30.0 - 36.0 g/dL    RDW 40.9  81.1 - 91.4 %    Platelets 398  150 - 400 K/uL   DIFFERENTIAL     Status: Abnormal   Collection Time   01/24/12  8:04 PM      Component Value Range Comment   Neutrophils Relative 33 (*) 43 - 77 %    Neutro Abs 4.0  1.7 - 7.7 K/uL    Lymphocytes Relative 57 (*) 12 - 46 %    Lymphs Abs 6.8 (*) 0.7 - 4.0 K/uL    Monocytes Relative 8  3 - 12 %    Monocytes Absolute 0.9  0.1 - 1.0 K/uL    Eosinophils Relative 2  0 - 5 %    Eosinophils Absolute 0.3  0.0 - 0.7 K/uL    Basophils Relative 1  0 - 1 %    Basophils Absolute 0.1  0.0 - 0.1 K/uL   COMPREHENSIVE METABOLIC PANEL     Status: Abnormal   Collection Time   01/24/12  8:04 PM      Component Value Range Comment   Sodium 139  135 - 145 mEq/L    Potassium 3.1 (*) 3.5 - 5.1 mEq/L    Chloride 99  96 - 112 mEq/L    CO2 29  19 - 32 mEq/L    Glucose, Bld 109 (*) 70 - 99 mg/dL    BUN 18  6 - 23 mg/dL    Creatinine, Ser 1.61  0.50 - 1.10 mg/dL    Calcium 9.1  8.4 - 09.6 mg/dL    Total Protein 7.7  6.0 - 8.3 g/dL    Albumin 3.6  3.5 - 5.2 g/dL    AST 28  0 - 37 U/L    ALT 12  0 - 35 U/L    Alkaline Phosphatase 61  39 - 117 U/L    Total Bilirubin 0.4  0.3 - 1.2 mg/dL    GFR calc non Af Amer 47 (*) >90 mL/min    GFR calc Af Amer 55 (*) >90 mL/min   CK TOTAL AND CKMB     Status: Normal   Collection Time   01/24/12  8:05 PM      Component Value Range Comment   Total CK 105  7 - 177 U/L    CK, MB 2.1  0.3 - 4.0 ng/mL    Relative Index 2.0  0.0 - 2.5   TROPONIN I     Status: Normal   Collection Time   01/24/12  8:05 PM       Component Value Range Comment   Troponin I <0.30  <0.30 ng/mL   GLUCOSE, CAPILLARY     Status: Abnormal   Collection Time   01/24/12  8:23 PM      Component Value Range Comment   Glucose-Capillary 107 (*) 70 - 99 mg/dL    Comment 1 Notify RN     POCT I-STAT, CHEM 8     Status: Abnormal   Collection Time   01/24/12  8:23 PM      Component Value Range Comment   Sodium 142  135 - 145 mEq/L    Potassium 3.2 (*) 3.5 - 5.1 mEq/L    Chloride 101  96 - 112 mEq/L    BUN 19  6 - 23 mg/dL    Creatinine, Ser 0.45 (*) 0.50 - 1.10 mg/dL    Glucose, Bld 409 (*) 70 - 99 mg/dL    Calcium, Ion 8.11 (*) 1.13 - 1.30 mmol/L    TCO2 29  0 - 100 mmol/L    Hemoglobin 15.0  12.0 - 15.0 g/dL    HCT 91.4  78.2 - 95.6 %   URINALYSIS, ROUTINE W REFLEX MICROSCOPIC     Status: Abnormal   Collection Time   01/24/12  9:47 PM      Component Value Range Comment   Color, Urine YELLOW  YELLOW    APPearance CLEAR  CLEAR    Specific Gravity, Urine 1.008  1.005 - 1.030    pH 7.0  5.0 - 8.0    Glucose, UA NEGATIVE  NEGATIVE mg/dL    Hgb urine dipstick TRACE (*) NEGATIVE  Bilirubin Urine NEGATIVE  NEGATIVE    Ketones, ur NEGATIVE  NEGATIVE mg/dL    Protein, ur NEGATIVE  NEGATIVE mg/dL    Urobilinogen, UA 1.0  0.0 - 1.0 mg/dL    Nitrite NEGATIVE  NEGATIVE    Leukocytes, UA SMALL (*) NEGATIVE   URINE MICROSCOPIC-ADD ON     Status: Normal   Collection Time   01/24/12  9:47 PM      Component Value Range Comment   Squamous Epithelial / LPF RARE  RARE    WBC, UA 0-2  <3 WBC/hpf    RBC / HPF 0-2  <3 RBC/hpf    Bacteria, UA RARE  RARE    Ct Head Wo Contrast  01/24/2012  *RADIOLOGY REPORT*  Clinical Data: Weakness, confusion  CT HEAD WITHOUT CONTRAST  Technique:  Contiguous axial images were obtained from the base of the skull through the vertex without contrast.  Comparison: None.  Findings: Focal hypoattenuation in the right basal ganglia may represent subacute infarct. Diffuse parenchymal atrophy. Patchy areas of  hypoattenuation in deep and periventricular white matter bilaterally. Negative for acute intracranial hemorrhage, mass lesion, acute infarction, midline shift, or mass-effect. Acute infarct may be inapparent on noncontrast CT. Ventricles and sulci symmetric. Bone windows demonstrate no focal lesion.  IMPRESSION:  1.  Focal hypoattenuation in the right basal ganglia may represent subacute or chronic non hemorrhagic lacunar type infarct. I telephoned the critical test results to Dr. Manus Gunning at the time of interpretation.  2. Atrophy and nonspecific white matter changes.  Original Report Authenticated By: Osa Craver, M.D.    Review of Systems  Constitutional: Negative.   HENT: Negative.   Eyes: Negative.   Respiratory: Negative.   Cardiovascular: Negative.   Gastrointestinal: Negative.   Genitourinary: Negative.   Musculoskeletal: Negative.   Skin: Negative.   Neurological: Positive for speech change.  Endo/Heme/Allergies: Negative.   Psychiatric/Behavioral: Negative.     Blood pressure 121/65, pulse 88, temperature 98 F (36.7 C), temperature source Oral, resp. rate 20, SpO2 100.00%. Physical Exam  Constitutional: She is oriented to person, place, and time. She appears well-developed and well-nourished. No distress.  HENT:  Head: Normocephalic and atraumatic.  Right Ear: External ear normal.  Left Ear: External ear normal.  Nose: Nose normal.  Mouth/Throat: Oropharynx is clear and moist. No oropharyngeal exudate.  Eyes: Conjunctivae are normal. Pupils are equal, round, and reactive to light. Right eye exhibits no discharge. Left eye exhibits no discharge. No scleral icterus.  Neck: Normal range of motion. Neck supple.  Cardiovascular: Normal rate.        Irregular rhythm.  Respiratory: Effort normal and breath sounds normal. No respiratory distress. She has no wheezes. She has no rales.  GI: Soft. Bowel sounds are normal. She exhibits no distension. There is no tenderness.  There is no rebound.  Musculoskeletal: Normal range of motion. She exhibits no edema and no tenderness.  Neurological: She is alert and oriented to person, place, and time.       Moves all extremities 5/5. No facial asymmetry. Tongue midline.  Skin: Skin is warm and dry. She is not diaphoretic.  Psychiatric: Her behavior is normal.     Assessment/Plan #1. Expressive aphasia - given patient's history of previous TIA and atrial fibrillation at this time symptoms are concerning for CVA. MRI/MRA brain, carotid Doppler and 2-D echo has been ordered. #2. Atrial fibrillation rate controlled - Coumadin per pharmacy.  Patient's medication reconciliation is to to be completed. Have instructed  nurses to cause once done.  CODE STATUS - full code.  Dorethy Tomey N. 01/24/2012, 11:02 PM

## 2012-01-24 NOTE — ED Notes (Signed)
The pt arrived by [facility] pt was visiting her husband at a nursing home in [place] and she started having speech difficulty and rt sided waekness at approx 1846.  Code stroke called at  enroute by ems approx 1850.  Pt alert on arrival no iv.  Speech clearing according to ems equal grips.  Difficulty answering questions with hesitation.  The speech and her ability to answer questions copmes and goes.

## 2012-01-24 NOTE — ED Notes (Signed)
Family member in waiting room waiting to see patient; informed family member to check back with RN in 15 minutes in order to give time for RN and MD assessment.  Family member verbalized understanding; will continue to monitor.

## 2012-01-24 NOTE — ED Notes (Signed)
Admitting doctor here to see 

## 2012-01-24 NOTE — ED Provider Notes (Signed)
History     CSN: [id number]  Arrival date & time 01/24/12  1951   First MD Initiated Contact with Patient 01/24/12 1952      No chief complaint on file.   (Consider location/radiation/quality/duration/timing/severity/associated sxs/prior treatment) Patient is a 78 y.o. female presenting with neurologic complaint. The history is provided by the EMS personnel and a relative.  Neurologic Problem The primary symptoms include speech change. Primary symptoms do not include headaches, seizures, fever, nausea or vomiting. The symptoms began 1 to 2 hours ago. The symptoms are improving. The neurological symptoms are focal. Context: while visiting husband in nursing home.  Change in speech began 1 - 3 hours ago. The speech change is improving. Features of the speech change include inability to articulate and inability to speak fluently.  Additional symptoms do not include weakness or pain. Medical issues do not include cerebral vascular accident.    No past medical history on file.  No past surgical history on file.  No family history on file.  History  Substance Use Topics  . Smoking status: Not on file  . Smokeless tobacco: Not on file  . Alcohol Use: Not on file    OB History    No data available      Review of Systems  Constitutional: Negative.  Negative for fever.  HENT: Negative for neck pain.   Eyes: Negative for visual disturbance.  Respiratory: Negative for shortness of breath.   Cardiovascular: Negative for chest pain and leg swelling.  Gastrointestinal: Negative for nausea, vomiting, abdominal pain and constipation.  Genitourinary: Negative for urgency, decreased urine volume and difficulty urinating.  Musculoskeletal: Negative.   Skin: Negative.   Neurological: Positive for speech change and speech difficulty. Negative for seizures, syncope, weakness and headaches.  Psychiatric/Behavioral: Negative.   All other systems reviewed and are negative.    Allergies    Review of patient's allergies indicates not on file.  Home Medications  No current outpatient prescriptions on file.  There were no vitals taken for this visit.  Physical Exam  Nursing note and vitals reviewed. Constitutional: She is oriented to person, place, and time. She appears well-developed and well-nourished. No distress.  HENT:  Head: Normocephalic and atraumatic.  Right Ear: External ear normal.  Left Ear: External ear normal.  Nose: Nose normal.  Mouth/Throat: Oropharynx is clear and moist.  Eyes: EOM are normal. Pupils are equal, round, and reactive to light.  Cardiovascular: Normal rate, regular rhythm, normal heart sounds and intact distal pulses.   Pulmonary/Chest: Effort normal and breath sounds normal. No respiratory distress. She has no wheezes. She has no rales.  Abdominal: Soft. She exhibits no distension. There is no tenderness.  Neurological: She is alert and oriented to person, place, and time. She has normal strength. No sensory deficit. She exhibits normal muscle tone.       Equal strength in all 4 extremities  Skin: Skin is warm and dry. She is not diaphoretic.    ED Course  Procedures (including critical care time)  Labs Reviewed  PROTIME-INR - Abnormal; Notable for the following:    Prothrombin Time 24.9 (*)     INR 2.21 (*)     All other components within normal limits  APTT - Abnormal; Notable for the following:    aPTT 39 (*)     All other components within normal limits  CBC - Abnormal; Notable for the following:    WBC 12.1 (*)     All other components within normal  limits  DIFFERENTIAL - Abnormal; Notable for the following:    Neutrophils Relative 33 (*)     Lymphocytes Relative 57 (*)     Lymphs Abs 6.8 (*)     All other components within normal limits  COMPREHENSIVE METABOLIC PANEL - Abnormal; Notable for the following:    Potassium 3.1 (*)     Glucose, Bld 109 (*)     GFR calc non Af Amer 47 (*)     GFR calc Af Amer 55 (*)     All  other components within normal limits  GLUCOSE, CAPILLARY - Abnormal; Notable for the following:    Glucose-Capillary 107 (*)     All other components within normal limits  POCT I-STAT, CHEM 8 - Abnormal; Notable for the following:    Potassium 3.2 (*)     Creatinine, Ser 1.20 (*)     Glucose, Bld 107 (*)     Calcium, Ion 1.04 (*)     All other components within normal limits  CK TOTAL AND CKMB  TROPONIN I   Ct Head Wo Contrast  01/24/2012  *RADIOLOGY REPORT*  Clinical Data: Weakness, confusion  CT HEAD WITHOUT CONTRAST  Technique:  Contiguous axial images were obtained from the base of the skull through the vertex without contrast.  Comparison: None.  Findings: Focal hypoattenuation in the right basal ganglia may represent subacute infarct. Diffuse parenchymal atrophy. Patchy areas of hypoattenuation in deep and periventricular white matter bilaterally. Negative for acute intracranial hemorrhage, mass lesion, acute infarction, midline shift, or mass-effect. Acute infarct may be inapparent on noncontrast CT. Ventricles and sulci symmetric. Bone windows demonstrate no focal lesion.  IMPRESSION:  1.  Focal hypoattenuation in the right basal ganglia may represent subacute or chronic non hemorrhagic lacunar type infarct. I telephoned the critical test results to Dr. Manus Gunning at the time of interpretation.  2. Atrophy and nonspecific white matter changes.  Original Report Authenticated By: Osa Craver, M.D.     1. CVA (cerebral vascular accident)       MDM  Onset 1845 per EMS. Was visiting husband in NH and had acute aphasia. Here has mild trouble following commands but is awake and alert and protecting airway. Improving symptoms while in ED. No extremity weakness. Neuro at bedside, they are against tPA based on improving symptoms, age and being on coumadin. Will admit to medicine.         Pricilla Loveless, MD 01/24/12 2330

## 2012-01-24 NOTE — ED Notes (Signed)
Report called to 4000

## 2012-01-24 NOTE — Code Documentation (Signed)
Rapid response and neuro present

## 2012-01-24 NOTE — Consult Note (Signed)
  Chief Complaint: Acute onset speech difficulty.  HPI: Casey White is an 78 y.o. female with a history of TIA and stroke as well as atrial fibrillation on Coumadin, who developed acute onset of speech output difficulty at 6:55 PM today. Patient apparently can understand what was being said to her but had difficulty getting out what she wanted to say. No focal weakness was noted. CT scan of her head showed a subacute to chronic small right low density area consistent with infarction. There were no acute intracranial findings. INR was 2.2. Patient improved after arriving in the emergency room but still had significant word finding difficulty and frequent paraphasic errors.  LSN: 6:55 PM on 01/24/2012 tPA Given: No: Therapeutic INR on Coumadin mRankin:  1  Past Medical History  Diagnosis Date  . TIA (transient ischemic attack)   . Dysrhythmia     History reviewed. No pertinent family history.   Medications:  Scheduled:   . aspirin  300 mg Rectal Daily   Or  . aspirin  325 mg Oral Daily   FAO:[id number]   Physical Examination: Blood pressure 121/65, pulse 88, temperature 98 F (36.7 C), temperature source Oral, resp. rate 20, SpO2 100.00%.  Neurologic Examination: Mental Status: Alert, oriented, thought content appropriate.  Patient had moderate expressive aphasia with word finding difficulty and frequent paraphasic errors. Able to follow commands without difficulty. Cranial Nerves: II-Visual fields were normal. III/IV/VI-Pupils were equal and reacted. Extraocular movements were full and conjugate.    V/VII-no facial numbness and no facial weakness. VIII-normal. X-moderate expressive aphasia; no dysarthria. XII-midline tongue extension Motor: 5/5 bilaterally with normal tone and bulk Sensory: Normal throughout. Deep Tendon Reflexes: 1+ and symmetric. Plantars: Mute bilaterally Cerebellar: Normal finger-to-nose testing.  Ct Head Wo Contrast  01/24/2012  *RADIOLOGY  REPORT*  Clinical Data: Weakness, confusion  CT HEAD WITHOUT CONTRAST  Technique:  Contiguous axial images were obtained from the base of the skull through the vertex without contrast.  Comparison: None.  Findings: Focal hypoattenuation in the right basal ganglia may represent subacute infarct. Diffuse parenchymal atrophy. Patchy areas of hypoattenuation in deep and periventricular white matter bilaterally. Negative for acute intracranial hemorrhage, mass lesion, acute infarction, midline shift, or mass-effect. Acute infarct may be inapparent on noncontrast CT. Ventricles and sulci symmetric. Bone windows demonstrate no focal lesion.  IMPRESSION:  1.  Focal hypoattenuation in the right basal ganglia may represent subacute or chronic non hemorrhagic lacunar type infarct. I telephoned the critical test results to Dr. Manus Gunning at the time of interpretation.  2. Atrophy and nonspecific white matter changes.  Original Report Authenticated By: Osa Craver, M.D.    Assessment: 78 y.o. female presenting with acute TIA versus stroke involving left MCA territory, with expressive aphasia which is improving.  Stroke Risk Factors - atrial fibrillation  Plan: 1. HgbA1c, fasting lipid panel 2. MRI, MRA  of the brain without contrast 3. PT consult, OT consult, Speech consult 4. Echocardiogram 5. Carotid dopplers 6. Prophylactic therapy-Anticoagulation: Coumadin - Pharmacy to manage 7. Risk factor modification 8. Telemetry monitoring  C.R. Roseanne Reno, MD [facility] [phone]  01/24/2012, 11:16 PM

## 2012-01-25 ENCOUNTER — Inpatient Hospital Stay (HOSPITAL_COMMUNITY): Payer: Medicare Other

## 2012-01-25 ENCOUNTER — Encounter (HOSPITAL_COMMUNITY): Payer: Self-pay | Admitting: Neurology

## 2012-01-25 DIAGNOSIS — I6350 Cerebral infarction due to unspecified occlusion or stenosis of unspecified cerebral artery: Principal | ICD-10-CM

## 2012-01-25 DIAGNOSIS — R918 Other nonspecific abnormal finding of lung field: Secondary | ICD-10-CM

## 2012-01-25 DIAGNOSIS — I4891 Unspecified atrial fibrillation: Secondary | ICD-10-CM

## 2012-01-25 DIAGNOSIS — E876 Hypokalemia: Secondary | ICD-10-CM | POA: Diagnosis present

## 2012-01-25 LAB — CBC WITH DIFFERENTIAL/PLATELET
Basophils Absolute: 0.1 K/uL (ref 0.0–0.1)
Basophils Relative: 1 % (ref 0–1)
Eosinophils Absolute: 0.1 K/uL (ref 0.0–0.7)
Eosinophils Relative: 1 % (ref 0–5)
HCT: 39.3 % (ref 36.0–46.0)
Hemoglobin: 12.8 g/dL (ref 12.0–15.0)
Lymphocytes Relative: 41 % (ref 12–46)
Lymphs Abs: 3.9 K/uL (ref 0.7–4.0)
MCH: 27.5 pg (ref 26.0–34.0)
MCHC: 32.6 g/dL (ref 30.0–36.0)
MCV: 84.5 fL (ref 78.0–100.0)
Monocytes Absolute: 0.7 K/uL (ref 0.1–1.0)
Monocytes Relative: 7 % (ref 3–12)
Neutro Abs: 4.7 K/uL (ref 1.7–7.7)
Neutrophils Relative %: 50 % (ref 43–77)
Platelets: 360 K/uL (ref 150–400)
RBC: 4.65 MIL/uL (ref 3.87–5.11)
RDW: 13.8 % (ref 11.5–15.5)
WBC: 9.4 K/uL (ref 4.0–10.5)

## 2012-01-25 LAB — LIPID PANEL
Cholesterol: 173 mg/dL (ref 0–200)
HDL: 45 mg/dL (ref >39)
Total CHOL/HDL Ratio: 3.8 RATIO
Triglycerides: 105 mg/dL (ref <150)
VLDL: 21 mg/dL (ref 0–40)

## 2012-01-25 LAB — COMPREHENSIVE METABOLIC PANEL
AST: 25 U/L (ref 0–37)
Albumin: 3.3 g/dL — ABNORMAL LOW (ref 3.5–5.2)
Chloride: 98 mEq/L (ref 96–112)
Creatinine, Ser: 0.84 mg/dL (ref 0.50–1.10)
Total Bilirubin: 0.6 mg/dL (ref 0.3–1.2)
Total Protein: 7.1 g/dL (ref 6.0–8.3)

## 2012-01-25 LAB — TSH: TSH: 4.679 u[IU]/mL — ABNORMAL HIGH (ref 0.350–4.500)

## 2012-01-25 MED ORDER — IOHEXOL 300 MG/ML  SOLN
80.0000 mL | Freq: Once | INTRAMUSCULAR | Status: AC | PRN
  Administered 2012-01-25: 80 mL via INTRAVENOUS

## 2012-01-25 MED ORDER — LORAZEPAM 2 MG/ML IJ SOLN: 1.0000 mg | Freq: Once | INTRAMUSCULAR | Status: AC

## 2012-01-25 MED ORDER — POTASSIUM CHLORIDE CRYS ER 20 MEQ PO TBCR
40.0000 meq | EXTENDED_RELEASE_TABLET | Freq: Two times a day (BID) | ORAL | Status: AC
  Administered 2012-01-25 (×2): 40 meq via ORAL
  Filled 2012-01-25 (×2): qty 2

## 2012-01-25 MED ORDER — LORAZEPAM 2 MG/ML IJ SOLN
INTRAMUSCULAR | Status: AC
  Administered 2012-01-25: 1 mg
  Filled 2012-01-25: qty 1

## 2012-01-25 MED ORDER — SENNOSIDES-DOCUSATE SODIUM 8.6-50 MG PO TABS: 1.0000 | ORAL_TABLET | Freq: Every evening | ORAL | Status: DC | PRN

## 2012-01-25 MED ORDER — ATORVASTATIN CALCIUM 20 MG PO TABS
20.0000 mg | ORAL_TABLET | Freq: Every day | ORAL | Status: DC
  Administered 2012-01-25 – 2012-01-26 (×2): 20 mg via ORAL
  Filled 2012-01-25 (×3): qty 1

## 2012-01-25 MED ORDER — ASPIRIN 325 MG PO TABS
325.0000 mg | ORAL_TABLET | Freq: Every day | ORAL | Status: DC
  Administered 2012-01-25 – 2012-01-26 (×2): 325 mg via ORAL
  Filled 2012-01-25 (×2): qty 1

## 2012-01-25 MED ORDER — ASPIRIN 300 MG RE SUPP
300.0000 mg | Freq: Every day | RECTAL | Status: DC
  Filled 2012-01-25: qty 1

## 2012-01-25 MED ORDER — SODIUM CHLORIDE 0.9 % IV SOLN
INTRAVENOUS | Status: AC
  Administered 2012-01-25: 01:00:00 via INTRAVENOUS

## 2012-01-25 MED ORDER — WARFARIN SODIUM 5 MG PO TABS
5.0000 mg | ORAL_TABLET | Freq: Once | ORAL | Status: AC
  Administered 2012-01-25: 5 mg via ORAL
  Filled 2012-01-25: qty 1

## 2012-01-25 MED ORDER — WARFARIN - PHARMACIST DOSING INPATIENT: Freq: Every day | Status: DC

## 2012-01-25 MED ORDER — POTASSIUM CHLORIDE 10 MEQ/100ML IV SOLN
10.0000 meq | INTRAVENOUS | Status: AC
  Administered 2012-01-25: 10 meq via INTRAVENOUS
  Filled 2012-01-25 (×2): qty 100

## 2012-01-25 NOTE — Progress Notes (Signed)
ANTICOAGULATION CONSULT NOTE - Initial Consult  Pharmacy Consult for Coumadin Indication: Atrial fibrillation, h/o CVA  Allergies  Allergen Reactions  . Amoxicillin Other (See Comments)    unknown  . Codeine Nausea And Vomiting  . Demerol (Meperidine) Nausea And Vomiting    Patient Measurements: Height: 5\' 6"  (167.6 cm) Weight: 135 lb 2.3 oz (61.3 kg) IBW/kg (Calculated) : 59.3   Vital Signs: Temp: 97.9 F (36.6 C) (08/12 0010) Temp src: Oral (08/12 0010) BP: 117/58 mmHg (08/12 0010) Pulse Rate: 66  (08/12 0010)  Labs:  Basename 01/24/12 2023 01/24/12 2005 01/24/12 2004  HGB 15.0 -- 13.7  HCT 44.0 -- 42.3  PLT -- -- 398  APTT -- -- 39*  LABPROT -- -- 24.9*  INR -- -- 2.21*  HEPARINUNFRC -- -- --  CREATININE 1.20* -- 1.00  CKTOTAL -- 105 --  CKMB -- 2.1 --  TROPONINI -- <0.30 --    Estimated Creatinine Clearance: 28 ml/min (by C-G formula based on Cr of 1.2).   Medical History: Past Medical History  Diagnosis Date  . TIA (transient ischemic attack)   . Dysrhythmia     Medications:  Scheduled:    . aspirin  300 mg Rectal Daily   Or  . aspirin  325 mg Oral Daily    Assessment: 78 yo female with h/o atrial fibrillation and CVA admitted with TIA vs CVA. INR (2.21) is at-goal. Pharmacy consulted to manage Coumadin. No family present currently and home Coumadin regimen not known at this time. Family to bring in medication bottles in AM.   Goal of Therapy:  INR 2-3 Monitor platelets by anticoagulation protocol: Yes   Plan:  1. Follow-up home Coumadin regimen.  2. Daily PT / INR.   Emeline Gins 01/25/2012,1:03 AM

## 2012-01-25 NOTE — Evaluation (Signed)
Occupational Therapy Evaluation Patient Details Name: Casey White MRN: [id number] DOB: 08/15/1917 Today's Date: 01/25/2012 Time: 1191-4782 OT Time Calculation (min): 37 min  OT Assessment / Plan / Recommendation Clinical Impression  Pt admitted with dizziness and dysarthria. CT scan of her head showed a subacute to chronic small right low density area consistent with infarction. Pt to undergo MRI today, 8/12. Pt presents with right sided weakness and right field cut vs. inattention. Pt will benefit from skilled OT in the acute setting to maximize I with ADL and ADL mobility prior to d/c. Pt is primary caregiver for husband; therefore, she will need to be able to return home Mod I/I. Recommend CIR for d/c plan pending pt's progress.    OT Assessment  Patient needs continued OT Services    Follow Up Recommendations  Inpatient Rehab    Barriers to Discharge Decreased caregiver support    Equipment Recommendations  Defer to next venue    Recommendations for Other Services Rehab consult  Frequency  Min 3X/week    Precautions / Restrictions Precautions Precautions: Fall Precaution Comments: Pt appears to have a Right visual field cut- will need to continue to assess Restrictions Weight Bearing Restrictions: No   Pertinent Vitals/Pain Pt denies any pain at this time    ADL  Grooming: Performed;Minimal assistance Where Assessed - Grooming: Supported standing (pt supports self on sink) Lower Body Dressing: Performed;Min guard;Set up (don socks) Where Assessed - Lower Body Dressing: Unsupported sitting Toilet Transfer: Simulated;Min guard Toilet Transfer Method: Sit to Barista:  (from bed)    OT Diagnosis: Generalized weakness;Acute pain  OT Problem List: Decreased strength;Decreased activity tolerance;Impaired balance (sitting and/or standing);Impaired vision/perception;Decreased coordination;Decreased knowledge of use of DME or AE;Impaired UE functional  use;Cardiopulmonary status limiting activity OT Treatment Interventions: Self-care/ADL training;DME and/or AE instruction;Therapeutic activities;Visual/perceptual remediation/compensation;Balance training;Patient/family education   OT Goals Acute Rehab OT Goals OT Goal Formulation: With patient Time For Goal Achievement: 02/08/12 Potential to Achieve Goals: Good ADL Goals Pt Will Perform Eating: Independently;Sitting, chair ADL Goal: Eating - Progress: Goal set today Pt Will Perform Grooming: with modified independence;Sitting at sink;Standing at sink ADL Goal: Grooming - Progress: Goal set today Pt Will Perform Upper Body Dressing: Independently;Sitting, chair;Sitting, bed ADL Goal: Upper Body Dressing - Progress: Goal set today Pt Will Perform Lower Body Dressing: with modified independence;Sit to stand from bed;Sit to stand from chair (all aspects) ADL Goal: Lower Body Dressing - Progress: Goal set today Pt Will Transfer to Toilet: with modified independence;Ambulation;with DME ADL Goal: Toilet Transfer - Progress: Goal set today Pt Will Perform Toileting - Clothing Manipulation: Independently;Standing ADL Goal: Toileting - Clothing Manipulation - Progress: Goal set today Pt Will Perform Tub/Shower Transfer: Tub transfer;Transfer tub bench;with modified independence ADL Goal: Tub/Shower Transfer - Progress: Goal set today Additional ADL Goal #1: Pt will I'ly perform visual scanning to locate 4/4 items during functional tasks. ADL Goal: Additional Goal #1 - Progress: Goal set today Additional ADL Goal #2: Pt will be I with RUE strengthening and coordination HEP in prep for increased I with ADLs. ADL Goal: Additional Goal #2 - Progress: Goal set today  Visit Information  Last OT Received On: 01/25/12    Subjective Data  Subjective: I want to get my husband home Patient Stated Goal: Return home   Prior Functioning  Vision/Perception  Home Living Lives With: Spouse Available  Help at Discharge: Family;Available 24 hours/day Type of Home: House Home Access: Ramped entrance Home Layout: One level Bathroom Shower/Tub: Tub/shower  unit Bathroom Toilet: Standard Home Adaptive Equipment: Tub transfer bench Additional Comments: two grandsons (17 and 19y/o) live with pt. Grandsons are capable of helping, but will be in school soon. Pt's husband is currently in SNF but was scheduled to have a "trial run" at home on Wed 8/14. 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 Pt presents as possible right field deficit/inattention but difficult to assess secondary to pt confusion with directions. Pt was unable to locate fingers held in right visual field or objects placed on right side without VC's. Will continue to assess.  Cognition  Overall Cognitive Status: Appears within functional limits for tasks assessed/performed Arousal/Alertness: Awake/alert Orientation Level: Appears intact for tasks assessed Behavior During Session: [facility] for tasks performed    Extremity/Trunk Assessment Right Upper Extremity Assessment RUE ROM/Strength/Tone: Deficits RUE ROM/Strength/Tone Deficits: grossly 4/5 RUE Sensation: WFL - Light Touch RUE Coordination: Deficits RUE Coordination Deficits: gross motor impaired- secondary to weakness? vs. proprioception? Fine motor appears intact    Mobility Bed Mobility Bed Mobility: Supine to Sit;Sitting - Scoot to Edge of Bed Supine to Sit: 5: Supervision;With rails;HOB elevated Sitting - Scoot to Edge of Bed: 5: Supervision;With rail Transfers Transfers: Sit to Stand;Stand to Sit Sit to Stand: From bed;With upper extremity assist;4: Min  assist Stand to Sit: 4: Min assist;With armrests;To chair/3-in-1 Details for Transfer Assistance: Pt with good hand placement. Assist to control descent into chair and for safety   Exercise    Balance    End of Session OT - End of Session Equipment Utilized During Treatment: Gait belt Activity Tolerance: Patient tolerated treatment well Patient left: in chair;with call bell/phone within reach Nurse Communication: Mobility status  GO     Casey White 01/25/2012, 10:21 AM

## 2012-01-25 NOTE — Progress Notes (Signed)
PT Cancellation Note  Evaluation cancelled today due to patient receiving procedure or test.  Casey White 01/25/2012, 3:23 PM Pager [phone]

## 2012-01-25 NOTE — Progress Notes (Signed)
[facility] PROGRESS NOTE  Casey White DOB: [birth year] DOA: 01/24/2012 PCP: Ignatius Specking., MD  Assessment/Plan: Principal Problem:  *Expressive aphasia Active Problems:  H/O TIA (transient ischemic attack) and stroke  Atrial fibrillation  1. TIA/ Expressive Aphasia: improved. CT head showed focal hypo attenuation int he right basal ganglia possibly subacute infarct. MRI brain showed Acute scattered left MCA infarcts, largest area of involvement in the posterior MCA territory. Superimposed punctate acute or subacute posterior right MCA lacunar infarct. Echo and Carotid Duplex are done and results pending.  On aspirin and Lipitor.   2. Diabetes Mellitus:   CBG (last 3)   Basename 01/24/12 2023  GLUCAP 107*   HGBA1c is 6. SSI.  3. Atrial Fibrillation : rate well controlled.  INR is sub therapeutic. On coumadin.   4. Hypokalemia: will replete as needed. Check labs in am.   5. Abnormal CXR: will get a CT chest with contrast.   DVT prophylaxis    Brief narrative:  78 year-old female with history of atrial fibrillation on Coumadin and previous history of TIA had gone to see her husband had nursing home at around 6:45 PM when she suddenly developed speech difficulties when she was found to have difficulty breathing out words as witnessed by patient's daughter who was by her side. She also felt dizzy but did not lose consciousness. She was brought to the ER. CT head was showing subacute to chronic basal ganglia infarct. Neurologist on-call Dr. Roseanne Reno had already seen the patient and at this time feels patient is not a candidate for TPA as patient is on a Coumadin which is therapeutic. Patient will be admitted for further workup.   Consultants:  neurology  Procedures: CT HEAD MRI BRAIN CXR ECHO CAROTID DUPLEX  Antibiotics:  CIPRO  HPI/Subjective: Comfortable.  Objective: Filed Vitals:   01/25/12 1020 01/25/12 1223 01/25/12 1400 01/25/12 1802  BP:  104/51 106/76 100/67 110/66  Pulse: 66 70 74 72  Temp: 97.6 F (36.4 C) 97.5 F (36.4 C) 97.3 F (36.3 C) 97.3 F (36.3 C)  TempSrc: Oral  Oral Oral  Resp: 16 18 18 18   Height:      Weight:      SpO2: 95% 98% 98% 97%    Intake/Output Summary (Last 24 hours) at 01/25/12 1901 Last data filed at 01/25/12 1700  Gross per 24 hour  Intake    600 ml  Output    402 ml  Net    198 ml   Filed Weights   01/25/12 0010  Weight: 61.3 kg (135 lb 2.3 oz)    Exam:   General: alert afebrile comfortable  Cardiovascular: s1s2  Respiratory:CTAB  Abdomen:soft NT ND BS+  Neuro: strength symmetrical both extremities. No facial asymmetry  Data Reviewed: Basic Metabolic Panel:  Lab 01/25/12 8119 01/24/12 2023 01/24/12 2004  NA 137 142 139  K 3.0* 3.2* 3.1*  CL 98 101 99  CO2 29 -- 29  GLUCOSE 96 107* 109*  BUN 15 19 18   CREATININE 0.84 1.20* 1.00  CALCIUM 9.0 -- 9.1  MG -- -- --  PHOS -- -- --   Liver Function Tests:  Lab 01/25/12 0450 01/24/12 2004  AST 25 28  ALT 9 12  ALKPHOS 55 61  BILITOT 0.6 0.4  PROT 7.1 7.7  ALBUMIN 3.3* 3.6   No results found for this basename: LIPASE:5,AMYLASE:5 in the last 168 hours No results found for this basename: AMMONIA:5 in the last 168 hours CBC:  Lab 01/25/12  1610 01/24/12 2023 01/24/12 2004  WBC 9.4 -- 12.1*  NEUTROABS 4.7 -- 4.0  HGB 12.8 15.0 13.7  HCT 39.3 44.0 42.3  MCV 84.5 -- 84.6  PLT 360 -- 398   Cardiac Enzymes:  Lab 01/24/12 2005  CKTOTAL 105  CKMB 2.1  CKMBINDEX --  TROPONINI <0.30   BNP (last 3 results) No results found for this basename: PROBNP:3 in the last 8760 hours CBG:  Lab 01/24/12 2023  GLUCAP 107*    No results found for this or any previous visit (from the past 240 hour(s)).   Studies: Dg Chest 2 View  01/25/2012  *RADIOLOGY REPORT*  Clinical Data: Stroke  CHEST - 2 VIEW  Comparison: None.  Findings: Ill-defined density is present at the right apex. Diffuse interstitial prominence and  bronchitic changes.  Mild cardiomegaly.  No pneumothorax or pleural effusion.  Compression fractures of T9 and T12 of indeterminate age are present.  IMPRESSION: Ill-defined right upper lobe opacity.  Comparison with prior studies is recommended.   If none are available, CT can be performed to exclude mass.  T9 and T12 compression fractures of indeterminate age.  Original Report Authenticated By: Donavan Burnet, M.D.   Ct Head Wo Contrast  01/24/2012  *RADIOLOGY REPORT*  Clinical Data: Weakness, confusion  CT HEAD WITHOUT CONTRAST  Technique:  Contiguous axial images were obtained from the base of the skull through the vertex without contrast.  Comparison: None.  Findings: Focal hypoattenuation in the right basal ganglia may represent subacute infarct. Diffuse parenchymal atrophy. Patchy areas of hypoattenuation in deep and periventricular white matter bilaterally. Negative for acute intracranial hemorrhage, mass lesion, acute infarction, midline shift, or mass-effect. Acute infarct may be inapparent on noncontrast CT. Ventricles and sulci symmetric. Bone windows demonstrate no focal lesion.  IMPRESSION:  1.  Focal hypoattenuation in the right basal ganglia may represent subacute or chronic non hemorrhagic lacunar type infarct. I telephoned the critical test results to Dr. Manus Gunning at the time of interpretation.  2. Atrophy and nonspecific white matter changes.  Original Report Authenticated By: Osa Craver, M.D.   Mr Brain Wo Contrast  01/25/2012  *RADIOLOGY REPORT*  Clinical Data:  78 year old female with acute onset of expressive aphasia and dizziness.  CT abnormality in the right basal ganglia.  Comparison: Head CT without contrast 01/24/2012.  MRI HEAD WITHOUT CONTRAST  Technique: Multiplanar, multiecho pulse sequences of the brain and surrounding structures were obtained according to standard protocol without intravenous contrast.  Findings: Confluent area of restricted diffusion in the  posterior left temporal lobe and lower left parietal lobe abutting the atrium of the left lateral ventricle (series 4 image 15).  Scattered additional mostly punctate areas of cortical restricted diffusion in the left superior frontal gyrus and left parietal lobe (image 22).  Additionally there is a punctate acute or subacute area of restricted diffusion in the right parietal lobe (image 20).  No other right hemisphere and no posterior fossa diffusion abnormality.  Mild T2 and FLAIR hyperintensity in the acutely affected areas.  No mass effect or hemorrhage. Major intracranial vascular flow voids are preserved, however, there is increased FLAIR signal and some posterior left MCA branches (series 8 image 14), see MRA findings below.  Patchy bilateral cerebral white matter T2 and FLAIR hyperintensity in the periventricular pattern.  Mild to moderate T2 heterogeneity in the deep gray matter nuclei and brain stem.  Occasional tiny chronic lacunar infarcts in the cerebellum.  Negative visualized cervical spine.  Normal bone  marrow signal.  Postoperative changes to the globes.  Negative paranasal sinuses and mastoids.  Negative scalp soft tissues.  IMPRESSION: 1.  Acute scattered left MCA infarcts, largest area of involvement in the posterior MCA territory.  No mass effect or hemorrhage. 2.  Superimposed punctate acute or subacute posterior right MCA lacunar infarct.  Legrand Rams this represents a synchronous ischemia (rather than an embolic phenomena). 3.  Underlying chronic small vessel disease. 4.  See MRA findings below.  MRA HEAD WITHOUT CONTRAST  Technique: Angiographic images of the Circle of Willis were obtained using MRA technique without  intravenous contrast.  Findings: Mildly degraded by motion despite repeated imaging attempts.  Antegrade flow in the posterior circulation codominant distal vertebral arteries.  Normal left PICA.  Patent vertebrobasilar junction with mild dolichoectasia.  Patent bilateral AICA  vessels. No basilar stenosis.  SCA and PCA origins are within normal limits. Posterior communicating arteries are diminutive or absent. Bilateral PCA branches are within normal limits allowing for some motion.  Antegrade flow in both ICA siphons.  Irregularity in both cavernous segment probably compounded by susceptibility artifact from the aerated sphenoid sinuses.  No hemodynamically significant ICA stenosis suspected.  Ophthalmic artery origins are within normal limits.  Patent carotid termini.  Dominant left ACA A1 segment.  Nondominant right A1 segment with irregularity and tandem stenoses.  Anterior communicating artery is patent.  Decreased right ACA A2 flow, or persistent nondominant anatomy.  Left MCA M1 segment is mildly irregular but patent.  No stenosis is identified at the bifurcation or involving the posterior sylvian division.  No major left MCA branch occlusion is identified.  Visualized right MCA branches likewise are within normal limits.  IMPRESSION: 1.  Anterior circulation atherosclerosis, but no definite stenosis or major branch occlusion correspond to the acute left MCA infarcts. 2.  Right ACA irregularity and decreased flow probably corresponds to a combination of atherosclerosis and congenital anatomic variation. 3.  Essentially negative posterior circulation.  Original Report Authenticated By: Harley Hallmark, M.D.   Mr Maxine Glenn Head/brain Wo Cm  01/25/2012  *RADIOLOGY REPORT*  Clinical Data:  78 year old female with acute onset of expressive aphasia and dizziness.  CT abnormality in the right basal ganglia.  Comparison: Head CT without contrast 01/24/2012.  MRI HEAD WITHOUT CONTRAST  Technique: Multiplanar, multiecho pulse sequences of the brain and surrounding structures were obtained according to standard protocol without intravenous contrast.  Findings: Confluent area of restricted diffusion in the posterior left temporal lobe and lower left parietal lobe abutting the atrium of the left  lateral ventricle (series 4 image 15).  Scattered additional mostly punctate areas of cortical restricted diffusion in the left superior frontal gyrus and left parietal lobe (image 22).  Additionally there is a punctate acute or subacute area of restricted diffusion in the right parietal lobe (image 20).  No other right hemisphere and no posterior fossa diffusion abnormality.  Mild T2 and FLAIR hyperintensity in the acutely affected areas.  No mass effect or hemorrhage. Major intracranial vascular flow voids are preserved, however, there is increased FLAIR signal and some posterior left MCA branches (series 8 image 14), see MRA findings below.  Patchy bilateral cerebral white matter T2 and FLAIR hyperintensity in the periventricular pattern.  Mild to moderate T2 heterogeneity in the deep gray matter nuclei and brain stem.  Occasional tiny chronic lacunar infarcts in the cerebellum.  Negative visualized cervical spine.  Normal bone marrow signal.  Postoperative changes to the globes.  Negative paranasal sinuses and mastoids.  Negative scalp soft tissues.  IMPRESSION: 1.  Acute scattered left MCA infarcts, largest area of involvement in the posterior MCA territory.  No mass effect or hemorrhage. 2.  Superimposed punctate acute or subacute posterior right MCA lacunar infarct.  Legrand Rams this represents a synchronous ischemia (rather than an embolic phenomena). 3.  Underlying chronic small vessel disease. 4.  See MRA findings below.  MRA HEAD WITHOUT CONTRAST  Technique: Angiographic images of the Circle of Willis were obtained using MRA technique without  intravenous contrast.  Findings: Mildly degraded by motion despite repeated imaging attempts.  Antegrade flow in the posterior circulation codominant distal vertebral arteries.  Normal left PICA.  Patent vertebrobasilar junction with mild dolichoectasia.  Patent bilateral AICA vessels. No basilar stenosis.  SCA and PCA origins are within normal limits. Posterior  communicating arteries are diminutive or absent. Bilateral PCA branches are within normal limits allowing for some motion.  Antegrade flow in both ICA siphons.  Irregularity in both cavernous segment probably compounded by susceptibility artifact from the aerated sphenoid sinuses.  No hemodynamically significant ICA stenosis suspected.  Ophthalmic artery origins are within normal limits.  Patent carotid termini.  Dominant left ACA A1 segment.  Nondominant right A1 segment with irregularity and tandem stenoses.  Anterior communicating artery is patent.  Decreased right ACA A2 flow, or persistent nondominant anatomy.  Left MCA M1 segment is mildly irregular but patent.  No stenosis is identified at the bifurcation or involving the posterior sylvian division.  No major left MCA branch occlusion is identified.  Visualized right MCA branches likewise are within normal limits.  IMPRESSION: 1.  Anterior circulation atherosclerosis, but no definite stenosis or major branch occlusion correspond to the acute left MCA infarcts. 2.  Right ACA irregularity and decreased flow probably corresponds to a combination of atherosclerosis and congenital anatomic variation. 3.  Essentially negative posterior circulation.  Original Report Authenticated By: Harley Hallmark, M.D.    Scheduled Meds:   . aspirin  300 mg Rectal Daily   Or  . aspirin  325 mg Oral Daily  . atorvastatin  20 mg Oral q1800  . LORazepam      . LORazepam  1 mg Intravenous Once  . potassium chloride  10 mEq Intravenous Q1 Hr x 2  . potassium chloride  40 mEq Oral BID  . warfarin  5 mg Oral ONCE-1800  . Warfarin - Pharmacist Dosing Inpatient   Does not apply q1800   Continuous Infusions:   . sodium chloride 75 mL/hr at 01/25/12 0112    Principal Problem:  *Expressive aphasia Active Problems:  H/O TIA (transient ischemic attack) and stroke  Atrial fibrillation    Time spent: 20 min    Casey White  [facility] Pager [phone]. If  8PM-8AM, please contact night-coverage at [email or web address], password [facility] 01/25/2012, 7:01 PM  LOS: 1 day

## 2012-01-25 NOTE — Progress Notes (Signed)
*  PRELIMINARY RESULTS* Echocardiogram 2D Echocardiogram has been performed.  Jeryl Columbia 01/25/2012, 3:59 PM

## 2012-01-25 NOTE — Progress Notes (Signed)
Stroke Team Progress Note  HISTORY Casey White is an 78 y.o. female with a history of TIA and stroke as well as atrial fibrillation on Coumadin, who developed acute onset of speech output difficulty at 6:55 PM 01/24/2012. Patient apparently can understand what was being said to her but had difficulty getting out what she wanted to say. No focal weakness was noted. CT scan of her head showed a subacute to chronic small right low density area consistent with infarction. There were no acute intracranial findings. INR was 2.2. Patient improved after arriving in the emergency room but still had significant word finding difficulty and frequent paraphasic errors. Patient was not a TPA candidate secondary to elevated INR on coumadin. She was admitted  for further evaluation and treatment.  SUBJECTIVE Her daugher is at the bedside.  Overall she feels her condition is stable. Patient is asking when she can go home. Language improving.  OBJECTIVE Most recent Vital Signs: Filed Vitals:   01/25/12 0641 01/25/12 0818 01/25/12 1020 01/25/12 1223  BP: 115/67 121/61 104/51 106/76  Pulse: 65 61 66 70  Temp: 97.9 F (36.6 C) 97.8 F (36.6 C) 97.6 F (36.4 C) 97.5 F (36.4 C)  TempSrc: Oral Oral Oral   Resp: 18 18 16 18   Height:      Weight:      SpO2: 97% 100% 95% 98%   CBG (last 3)  Basename 01/24/12 2023  GLUCAP 107*   Intake/Output from previous day: 08/11 0701 - 08/12 0700 In: 120 [P.O.:120] Out: 402 [Urine:401; Stool:1]  IV Fluid Intake:     . sodium chloride 75 mL/hr at 01/25/12 0112   MEDICATIONS    . aspirin  300 mg Rectal Daily   Or  . aspirin  325 mg Oral Daily  . LORazepam      . LORazepam  1 mg Intravenous Once   PRN:  senna-docusate  Diet:  Cardiac thin liquids Activity:  Bedrest, OOB with assistance DVT Prophylaxis:  coumadin  CLINICALLY SIGNIFICANT STUDIES Basic Metabolic Panel:  Lab 01/25/12 0102 01/24/12 2023 01/24/12 2004  NA 137 142 --  K 3.0* 3.2* --  CL 98 101  --  CO2 29 -- 29  GLUCOSE 96 107* --  BUN 15 19 --  CREATININE 0.84 1.20* --  CALCIUM 9.0 -- 9.1  MG -- -- --  PHOS -- -- --   Liver Function Tests:  Lab 01/25/12 0450 01/24/12 2004  AST 25 28  ALT 9 12  ALKPHOS 55 61  BILITOT 0.6 0.4  PROT 7.1 7.7  ALBUMIN 3.3* 3.6   CBC:  Lab 01/25/12 0253 01/24/12 2023 01/24/12 2004  WBC 9.4 -- 12.1*  NEUTROABS 4.7 -- 4.0  HGB 12.8 15.0 --  HCT 39.3 44.0 --  MCV 84.5 -- 84.6  PLT 360 -- 398   Coagulation:  Lab 01/25/12 0450 01/24/12 2004  LABPROT 22.6* 24.9*  INR 1.95* 2.21*   Cardiac Enzymes:  Lab 01/24/12 2005  CKTOTAL 105  CKMB 2.1  CKMBINDEX --  TROPONINI <0.30   Urinalysis:  Lab 01/24/12 2147  COLORURINE YELLOW  LABSPEC 1.008  PHURINE 7.0  GLUCOSEU NEGATIVE  HGBUR TRACE*  BILIRUBINUR NEGATIVE  KETONESUR NEGATIVE  PROTEINUR NEGATIVE  UROBILINOGEN 1.0  NITRITE NEGATIVE  LEUKOCYTESUR SMALL*   Lipid Panel    Component Value Date/Time   CHOL 173 01/25/2012 0450   TRIG 105 01/25/2012 0450   HDL 45 01/25/2012 0450   CHOLHDL 3.8 01/25/2012 0450   VLDL 21 01/25/2012 0450  LDLCALC 107* 01/25/2012 0450   HgbA1C  Lab Results  Component Value Date   HGBA1C 6.0* 01/25/2012   Urine Drug Screen:   No results found for this basename: labopia, cocainscrnur, labbenz, amphetmu, thcu, labbarb    Alcohol Level: No results found for this basename: ETH:2 in the last 168 hours   CT of the brain  01/24/2012   1.  Focal hypoattenuation in the right basal ganglia may represent subacute or chronic non hemorrhagic lacunar type infarct. I telephoned the critical test results to Dr. Manus Gunning at the time of interpretation.  2. Atrophy and nonspecific white matter changes.   MRI of the brain  01/25/2012   1.  Acute scattered left MCA infarcts, largest area of involvement in the posterior left MCA territory.  No mass effect or hemorrhage. 2.  Superimposed punctate acute or subacute posterior right MCA lacunar infarct.    MRA of the brain   01/25/2012 1.  Anterior circulation atherosclerosis, but no definite stenosis or major branch occlusion correspond to the acute left MCA infarcts. 2.  Right ACA irregularity and decreased flow probably corresponds to a combination of atherosclerosis and congenital anatomic variation. 3.  Essentially negative posterior circulation.    2D Echocardiogram    Carotid Doppler    CXR    EKG  atrial fibrillation, rate 78.   Therapy Recommendations PT - ; OT - IP rehab ; ST -   Physical Exam   GENERAL EXAM: Patient is in no distress  CARDIOVASCULAR: Regular rate and rhythm, no murmurs, no carotid bruits  NEUROLOGIC: MENTAL STATUS: awake, alert; MILD LANGUAGE DIFF WITH EXPRESSION AND COMPREHENSION.  CRANIAL NERVE: pupils equal and reactive to light, visual fields NOTABLE FOR RIGHT HEMIANOPSIA. Extraocular muscles intact, no nystagmus, facial sensation and strength symmetric, uvula midline, shoulder shrug symmetric, tongue midline. MOTOR: normal bulk and tone, SYMM strength in the BUE, BLE (DIFFUSELY 4/5). SENSORY: normal and symmetric to light touch; NO EXTINCTION  ASSESSMENT Casey White is a 78 y.o. female presenting with expressive aphasia . Imaging confirms bilateral MCA infarcts, mainly in left parietal region, likely due to cardioembolic source (atrial fibrillation) inspite of therapeutic warfarin, INR 2.2 . Remains on on warfarin for secondary stroke prevention. Patient with resultant aphasia and right visual field defect.  -atrial fibrillation, on coumadin PTA -hx TIA  Hospital day # 1  TREATMENT/PLAN -Continue warfarin for secondary stroke prevention. -therapy evals -complete stroke workup -needs statin for LDL 107  SHARON BIBY, MSN, RN, ANVP-BC, ANP-BC, GNP-BC [facility] Pager: [phone] 01/25/2012 1:09 PM  Scribe for Dr. Joycelyn Schmid who has personally reviewed chart, pertinent data, examined the patient and developed the plan of care.  Pager:   [phone]   [facility] - Stroke Team Joycelyn Schmid, MD 01/25/2012, 1:23 PM   Please refer to [email or web address] for on-call Stroke MD

## 2012-01-25 NOTE — Progress Notes (Signed)
Casey White called regarding pt having three beats of non-sustained Vtach. Pt asymptomatic. VS stable. No orders given. Will continue to monitor pt. Salvadore Oxford, RN 01/25/12 [phone]

## 2012-01-25 NOTE — Progress Notes (Signed)
*  PRELIMINARY RESULTS* Vascular Ultrasound Carotid Duplex (Doppler) has been completed.  Preliminary findings: Bilaterally no significant ICA stenosis with antegrade vertebral flow.  Farrel Demark, RDMS, RVT  01/25/2012, 3:19 PM

## 2012-01-25 NOTE — Progress Notes (Signed)
At 0407 had 13 beats of non-sustained Vtach. Pt asymptomatic and VS stable. Casey White paged and no new orders given. Will monitor pt.

## 2012-01-25 NOTE — Progress Notes (Signed)
ANTICOAGULATION CONSULT NOTE - Initial Consult  Pharmacy Consult for Coumadin Indication: h/o Atrial fibrillation/stroke  Allergies  Allergen Reactions  . Amoxicillin Other (See Comments)    unknown  . Codeine Nausea And Vomiting  . Demerol (Meperidine) Nausea And Vomiting    Patient Measurements: Height: 5\' 6"  (167.6 cm) Weight: 135 lb 2.3 oz (61.3 kg) IBW/kg (Calculated) : 59.3   Vital Signs: Temp: 97.3 F (36.3 C) (08/12 1400) Temp src: Oral (08/12 1400) BP: 100/67 mmHg (08/12 1400) Pulse Rate: 74  (08/12 1400)  Labs:  Basename 01/25/12 0450 01/25/12 0253 01/24/12 2023 01/24/12 2005 01/24/12 2004  HGB -- 12.8 15.0 -- --  HCT -- 39.3 44.0 -- 42.3  PLT -- 360 -- -- 398  APTT -- -- -- -- 39*  LABPROT 22.6* -- -- -- 24.9*  INR 1.95* -- -- -- 2.21*  HEPARINUNFRC -- -- -- -- --  CREATININE 0.84 -- 1.20* -- 1.00  CKTOTAL -- -- -- 105 --  CKMB -- -- -- 2.1 --  TROPONINI -- -- -- <0.30 --    Estimated Creatinine Clearance: 48.3 ml/min (by C-G formula based on Cr of 0.84).   Medical History: Past Medical History  Diagnosis Date  . TIA (transient ischemic attack)   . Dysrhythmia     Medications:  Scheduled:     . aspirin  300 mg Rectal Daily   Or  . aspirin  325 mg Oral Daily  . atorvastatin  20 mg Oral q1800  . LORazepam      . LORazepam  1 mg Intravenous Once  . potassium chloride  10 mEq Intravenous Q1 Hr x 2  . potassium chloride  40 mEq Oral BID    Assessment: 78 yo female with h/o atrial fibrillation and stroke admitted with TIA vs CVA. INR subtherapeutic and trend down. Patient takes 5mg  daily except 2.5mg  on T/TH/Sat/Sun.   Goal of Therapy:  INR 2-3 Monitor platelets by anticoagulation protocol: Yes   Plan:  Coumadin 5mg  today and f/u daily protime.  Verlene Mayer, PharmD, BCPS Pager [phone] 01/25/2012,2:53 PM

## 2012-01-26 DIAGNOSIS — I6340 Cerebral infarction due to embolism of unspecified cerebral artery: Secondary | ICD-10-CM

## 2012-01-26 DIAGNOSIS — I69921 Dysphasia following unspecified cerebrovascular disease: Secondary | ICD-10-CM

## 2012-01-26 LAB — CBC
HCT: 42.1 % (ref 36.0–46.0)
MCV: 85.4 fL (ref 78.0–100.0)
RBC: 4.93 MIL/uL (ref 3.87–5.11)
WBC: 10.3 10*3/uL (ref 4.0–10.5)

## 2012-01-26 LAB — MAGNESIUM: Magnesium: 2.7 mg/dL — ABNORMAL HIGH (ref 1.5–2.5)

## 2012-01-26 LAB — BASIC METABOLIC PANEL
BUN: 15 mg/dL (ref 6–23)
CO2: 25 mEq/L (ref 19–32)
Chloride: 106 mEq/L (ref 96–112)
Creatinine, Ser: 0.84 mg/dL (ref 0.50–1.10)

## 2012-01-26 MED ORDER — WARFARIN SODIUM 5 MG PO TABS
5.0000 mg | ORAL_TABLET | ORAL | Status: DC
  Filled 2012-01-26: qty 1

## 2012-01-26 MED ORDER — WARFARIN SODIUM 2.5 MG PO TABS
2.5000 mg | ORAL_TABLET | ORAL | Status: AC
  Administered 2012-01-26: 2.5 mg via ORAL
  Filled 2012-01-26: qty 1

## 2012-01-26 NOTE — Progress Notes (Signed)
ANTICOAGULATION CONSULT NOTE - Initial Consult  Pharmacy Consult for Coumadin Indication: h/o Atrial fibrillation/stroke  Allergies  Allergen Reactions  . Amoxicillin Other (See Comments)    unknown  . Codeine Nausea And Vomiting  . Demerol (Meperidine) Nausea And Vomiting    Patient Measurements: Height: 5\' 6"  (167.6 cm) Weight: 135 lb 2.3 oz (61.3 kg) IBW/kg (Calculated) : 59.3   Vital Signs: Temp: 97.4 F (36.3 C) (08/13 0958) Temp src: Oral (08/13 0958) BP: 98/58 mmHg (08/13 0958) Pulse Rate: 68  (08/13 0958)  Labs:  Alvira Philips 01/26/12 0620 01/25/12 0450 01/25/12 0253 01/24/12 2023 01/24/12 2005 01/24/12 2004  HGB 14.0 -- 12.8 -- -- --  HCT 42.1 -- 39.3 44.0 -- --  PLT 376 -- 360 -- -- 398  APTT -- -- -- -- -- 39*  LABPROT 26.0* 22.6* -- -- -- 24.9*  INR 2.34* 1.95* -- -- -- 2.21*  HEPARINUNFRC -- -- -- -- -- --  CREATININE 0.84 0.84 -- 1.20* -- --  CKTOTAL -- -- -- -- 105 --  CKMB -- -- -- -- 2.1 --  TROPONINI -- -- -- -- <0.30 --    Estimated Creatinine Clearance: 48.3 ml/min (by C-G formula based on Cr of 0.84).   Medical History: Past Medical History  Diagnosis Date  . TIA (transient ischemic attack)   . Dysrhythmia     Medications:  Scheduled:     . aspirin  325 mg Oral Daily  . atorvastatin  20 mg Oral q1800  . potassium chloride  10 mEq Intravenous Q1 Hr x 2  . potassium chloride  40 mEq Oral BID  . warfarin  5 mg Oral ONCE-1800  . Warfarin - Pharmacist Dosing Inpatient   Does not apply q1800  . DISCONTD: aspirin  300 mg Rectal Daily    Assessment: 78 yo female with h/o atrial fibrillation and stroke admitted with TIA vs CVA. INR therapeutic and trend up. Patient takes 5mg  daily except 2.5mg  on T/TH/Sat/Sun. Will resume this regimen. Patient failed coumadin since was admitted therapeutic with stroke. Should we consider alternative therapy? Xarelto?  Goal of Therapy:  INR 2-3 Monitor platelets by anticoagulation protocol: Yes   Plan:    Coumadin 2.5mg  daily except 5mg  MWF. Daily protime.   Verlene Mayer, PharmD, BCPS Pager [phone] 01/26/2012,11:31 AM

## 2012-01-26 NOTE — Consult Note (Signed)
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 Pearlean Brownie   HPI: Casey White is a 78 right-handed female with history of age of fibrillation on chronic Coumadin therapy and previous history of TIA. Admitted 01/24/2012 after gone to visit her husband who resides in a nursing home when she developed acute onset of speech difficulty. MRI of the brain showed acute scattered left MCA infarcts, largest area of involvement in the posterior MCA territory without mass effect. Superimposed punctate acute or subacute posterior right MCA lacunar infarct. Echocardiogram with ejection fraction of 45% with akinesis of the distal inferior septal anterior septal and apical myocardium. There was no cardiac source of emboli identified. Carotid Dopplers with no ICA stenosis. INR on admission of 2.2. Neurology consulted with aspirin therapy add that in addition to Coumadin. Physical and occupational therapy ongoing with recommendations of physical medicine rehabilitation consult to consider inpatient rehabilitation services   Review of Systems  Cardiovascular: Positive for palpitations.  Neurological: Positive for dizziness.  All other systems reviewed and are negative.   Past Medical History  Diagnosis Date  . TIA (transient ischemic attack)   . Dysrhythmia    Past Surgical History  Procedure Date  . Abdominal hysterectomy   . Appendectomy    History reviewed. No pertinent family history. Social History:  reports that she has never smoked. She does not have any smokeless tobacco history on file. She reports that she does not drink alcohol or use illicit drugs. Allergies:  Allergies  Allergen Reactions  . Amoxicillin Other (See Comments)    unknown  . Codeine Nausea And Vomiting  . Demerol (Meperidine) Nausea And Vomiting   Medications Prior to Admission  Medication Sig Dispense Refill  . CALCIUM PO Take 1 tablet  by mouth daily.      . furosemide (LASIX) 40 MG tablet Take 60 mg by mouth 2 (two) times daily.      . metoprolol succinate (TOPROL-XL) 50 MG 24 hr tablet Take 50 mg by mouth daily. Take with or immediately following a meal.      . traMADol-acetaminophen (ULTRACET) 37.5-325 MG per tablet Take 1-2 tablets by mouth every 6 (six) hours as needed. For pain      . warfarin (COUMADIN) 5 MG tablet Take 2.5-5 mg by mouth. Takes 5 mg on Monday, Wednesday , & Fridays and 2.5 mg all the other days of the week.        Home: Home Living Lives With: Spouse;Other (Comment) (17 and 90 yo grandsons) Available Help at Discharge: Family;Available 24 hours/day Type of Home: House Home Access: Ramped entrance Home Layout: One level Bathroom Shower/Tub: Engineer, manufacturing systems: Standard Home Adaptive Equipment: Tub transfer bench Additional Comments: two grandsons (17 and 19y/o) live with pt. Grandsons are capable of helping, but will be in school soon. Pt's husband is currently in SNF but was scheduled to have a "trial run" at home on Wed 8/14. Wife is primary caregiver for her husband who requires assist for majority of ADLs  Functional History: Prior Function Able to Take Stairs?: Reciprically Driving: Yes Vocation: Retired Functional Status:  Mobility: Bed Mobility Bed Mobility: Supine to Sit;Sitting - Scoot to Edge of Bed;Sit to Supine Supine to Sit: 5: Supervision;HOB flat Sitting - Scoot to Edge of Bed: 5: Supervision Sit to Supine: 6: Modified independent (Device/Increase time);HOB flat Transfers Transfers: Sit to Stand;Stand to Sit Sit to Stand: 4: Min guard;With upper extremity assist;From bed;From chair/3-in-1;From toilet  Stand to Sit: 4: Min guard;With upper extremity assist;With armrests;To bed;To chair/3-in-1;To toilet Ambulation/Gait Ambulation/Gait Assistance: 4: Min guard Ambulation Distance (Feet): 80 Feet Assistive device: None Ambulation/Gait Assistance Details: Pt  reaching to hold onto objects; denies use of device PTA; denies furniture walking PTA; very cautious  Gait Pattern: Step-through pattern;Decreased stride length;Right foot flat;Left foot flat;Right flexed knee in stance;Left flexed knee in stance;Shuffle;Trunk flexed Gait velocity: significantly impaired    ADL: ADL Grooming: Performed;Minimal assistance Where Assessed - Grooming: Supported standing (pt supports self on sink) Lower Body Dressing: Performed;Min guard;Set up (don socks) Where Assessed - Lower Body Dressing: Unsupported sitting Toilet Transfer: Simulated;Min guard Toilet Transfer Method: Sit to Barista:  (from bed)  Cognition: Cognition Arousal/Alertness: Awake/alert Orientation Level: Oriented to person;Disoriented to place;Disoriented to time;Disoriented to situation Cognition Overall Cognitive Status: Impaired Area of Impairment: Safety/judgement Arousal/Alertness: Awake/alert Orientation Level: Time;Situation Behavior During Session: WFL for tasks performed Safety/Judgement: Decreased awareness of safety precautions;Decreased awareness of need for assistance (per RN, pt attempting to get up alone)  Blood pressure 98/58, pulse 68, temperature 97.4 F (36.3 C), temperature source Oral, resp. rate 18, height 5\' 6"  (1.676 m), weight 61.3 kg (135 lb 2.3 oz), SpO2 98.00%. Physical Exam  Constitutional: She appears well-developed.  HENT:  Head: Normocephalic.  Eyes:       Pupils round and reactive to light  Neck: Neck supple. No thyromegaly present.  Cardiovascular:       Cardiac rate controlled  Pulmonary/Chest: Breath sounds normal. She has no wheezes.  Abdominal: Bowel sounds are normal. She exhibits no distension. There is no tenderness. There is no rebound and no guarding.  Musculoskeletal: She exhibits no edema.  Neurological: She is alert.       Patient follows two-step commands but is easily distracted. She does appear to have some  expressive aphasia. She was able to give her name and age 78  Skin: Skin is warm and dry.  apraxia noted with left upper extremity Motor strength is 4/5 in bilateral upper and lower extremities. Sensation testing is difficult to perform secondary to her word finding difficulties.  Results for orders placed during the hospital encounter of 01/24/12 (from the past 24 hour(s))  PROTIME-INR     Status: Abnormal   Collection Time   01/26/12  6:20 AM      Component Value Range   Prothrombin Time 26.0 (*) 11.6 - 15.2 seconds   INR 2.34 (*) 0.00 - 1.49  CBC     Status: Normal   Collection Time   01/26/12  6:20 AM      Component Value Range   WBC 10.3  4.0 - 10.5 K/uL   RBC 4.93  3.87 - 5.11 MIL/uL   Hemoglobin 14.0  12.0 - 15.0 g/dL   HCT 16.1  09.6 - 04.5 %   MCV 85.4  78.0 - 100.0 fL   MCH 28.4  26.0 - 34.0 pg   MCHC 33.3  30.0 - 36.0 g/dL   RDW 40.9  81.1 - 91.4 %   Platelets 376  150 - 400 K/uL  BASIC METABOLIC PANEL     Status: Abnormal   Collection Time   01/26/12  6:20 AM      Component Value Range   Sodium 142  135 - 145 mEq/L   Potassium 4.5  3.5 - 5.1 mEq/L   Chloride 106  96 - 112 mEq/L   CO2 25  19 - 32 mEq/L   Glucose, Bld 95  70 - 99 mg/dL   BUN 15  6 - 23 mg/dL   Creatinine, Ser 7.84  0.50 - 1.10 mg/dL   Calcium 9.5  8.4 - 69.6 mg/dL   GFR calc non Af Amer 63 (*) >90 mL/min   GFR calc Af Amer 73 (*) >90 mL/min  MAGNESIUM     Status: Abnormal   Collection Time   01/26/12  6:20 AM      Component Value Range   Magnesium 2.7 (*) 1.5 - 2.5 mg/dL   Dg Chest 2 View  2/95/2841  *RADIOLOGY REPORT*  Clinical Data: Stroke  CHEST - 2 VIEW  Comparison: None.  Findings: Ill-defined density is present at the right apex. Diffuse interstitial prominence and bronchitic changes.  Mild cardiomegaly.  No pneumothorax or pleural effusion.  Compression fractures of T9 and T12 of indeterminate age are present.  IMPRESSION: Ill-defined right upper lobe opacity.  Comparison with prior studies  is recommended.   If none are available, CT can be performed to exclude mass.  T9 and T12 compression fractures of indeterminate age.  Original Report Authenticated By: Donavan Burnet, M.D.   Ct Head Wo Contrast  01/24/2012  *RADIOLOGY REPORT*  Clinical Data: Weakness, confusion  CT HEAD WITHOUT CONTRAST  Technique:  Contiguous axial images were obtained from the base of the skull through the vertex without contrast.  Comparison: None.  Findings: Focal hypoattenuation in the right basal ganglia may represent subacute infarct. Diffuse parenchymal atrophy. Patchy areas of hypoattenuation in deep and periventricular white matter bilaterally. Negative for acute intracranial hemorrhage, mass lesion, acute infarction, midline shift, or mass-effect. Acute infarct may be inapparent on noncontrast CT. Ventricles and sulci symmetric. Bone windows demonstrate no focal lesion.  IMPRESSION:  1.  Focal hypoattenuation in the right basal ganglia may represent subacute or chronic non hemorrhagic lacunar type infarct. I telephoned the critical test results to Dr. Manus Gunning at the time of interpretation.  2. Atrophy and nonspecific white matter changes.  Original Report Authenticated By: Osa Craver, M.D.   Ct Chest W Contrast  01/25/2012  *RADIOLOGY REPORT*  Clinical Data: Chest pain.  Short of breath.  Abnormal chest x-ray.  CT CHEST WITH CONTRAST  Technique:  Multidetector CT imaging of the chest was performed following the standard protocol during bolus administration of intravenous contrast.  Contrast: 80mL OMNIPAQUE IOHEXOL 300 MG/ML  SOLN  Comparison: 01/25/2012.  Findings: Cardiomegaly.  Aortic atherosclerosis.  No acute aortic abnormality.  No effusion.  Incidental imaging of the upper abdomen is within normal limits.  Right apical density represents an area of linear scarring.  No discrete mass lesion is identified.  There is no airspace disease. Interlobular septal thickening is present.  Scattered subpleural  nodules are present diffusely.  Linear subsegmental atelectasis in the right lower lobe.  8 mm by 5 mm nodule is present in the anterior right middle lobe.  There is an enlarged carinal lymph node measuring 18 mm short axis. Small right hilar lymph node is also present.  These may be congestive or reactive. No aggressive osseous lesions are present. L1 compression fracture with retropulsion appears chronic.  T10 compression fracture is present with minimal retropulsion also appears chronic with endplate sclerosis.  IMPRESSION: 1.  Density in the right upper lobe on chest radiograph represents an area of subpleural scarring. 2.  Scattered subpleural nodules are present, most prominent in the anterior right middle lobe measuring 8 mm x 5 mm. If the patient is at high risk for  bronchogenic carcinoma, follow-up chest CT at 6-12 months is recommended.  If the patient is at low risk for bronchogenic carcinoma, follow-up chest CT at 12 months is recommended.  This recommendation follows the consensus statement: Guidelines for Management of Small Pulmonary Nodules Detected on CT Scans: A Statement from the Fleischner Society as published in Radiology 2005; 237:395-400. 3.  Precarinal lymph node measuring 18 mm short axis is nonspecific.  This may be congestive or reactive.  For interlobular septal thickening is present diffusely, which is nonspecific. Potentially this could relate to interstitial pulmonary edema in this patient with cardiomegaly.  Original Report Authenticated By: Andreas Newport, M.D.   Mr Brain Wo Contrast  01/25/2012  *RADIOLOGY REPORT*  Clinical Data:  78 year old female with acute onset of expressive aphasia and dizziness.  CT abnormality in the right basal ganglia.  Comparison: Head CT without contrast 01/24/2012.  MRI HEAD WITHOUT CONTRAST  Technique: Multiplanar, multiecho pulse sequences of the brain and surrounding structures were obtained according to standard protocol without intravenous  contrast.  Findings: Confluent area of restricted diffusion in the posterior left temporal lobe and lower left parietal lobe abutting the atrium of the left lateral ventricle (series 4 image 15).  Scattered additional mostly punctate areas of cortical restricted diffusion in the left superior frontal gyrus and left parietal lobe (image 22).  Additionally there is a punctate acute or subacute area of restricted diffusion in the right parietal lobe (image 20).  No other right hemisphere and no posterior fossa diffusion abnormality.  Mild T2 and FLAIR hyperintensity in the acutely affected areas.  No mass effect or hemorrhage. Major intracranial vascular flow voids are preserved, however, there is increased FLAIR signal and some posterior left MCA branches (series 8 image 14), see MRA findings below.  Patchy bilateral cerebral white matter T2 and FLAIR hyperintensity in the periventricular pattern.  Mild to moderate T2 heterogeneity in the deep gray matter nuclei and brain stem.  Occasional tiny chronic lacunar infarcts in the cerebellum.  Negative visualized cervical spine.  Normal bone marrow signal.  Postoperative changes to the globes.  Negative paranasal sinuses and mastoids.  Negative scalp soft tissues.  IMPRESSION: 1.  Acute scattered left MCA infarcts, largest area of involvement in the posterior MCA territory.  No mass effect or hemorrhage. 2.  Superimposed punctate acute or subacute posterior right MCA lacunar infarct.  Legrand Rams this represents a synchronous ischemia (rather than an embolic phenomena). 3.  Underlying chronic small vessel disease. 4.  See MRA findings below.  MRA HEAD WITHOUT CONTRAST  Technique: Angiographic images of the Circle of Willis were obtained using MRA technique without  intravenous contrast.  Findings: Mildly degraded by motion despite repeated imaging attempts.  Antegrade flow in the posterior circulation codominant distal vertebral arteries.  Normal left PICA.  Patent  vertebrobasilar junction with mild dolichoectasia.  Patent bilateral AICA vessels. No basilar stenosis.  SCA and PCA origins are within normal limits. Posterior communicating arteries are diminutive or absent. Bilateral PCA branches are within normal limits allowing for some motion.  Antegrade flow in both ICA siphons.  Irregularity in both cavernous segment probably compounded by susceptibility artifact from the aerated sphenoid sinuses.  No hemodynamically significant ICA stenosis suspected.  Ophthalmic artery origins are within normal limits.  Patent carotid termini.  Dominant left ACA A1 segment.  Nondominant right A1 segment with irregularity and tandem stenoses.  Anterior communicating artery is patent.  Decreased right ACA A2 flow, or persistent nondominant anatomy.  Left MCA M1 segment  is mildly irregular but patent.  No stenosis is identified at the bifurcation or involving the posterior sylvian division.  No major left MCA branch occlusion is identified.  Visualized right MCA branches likewise are within normal limits.  IMPRESSION: 1.  Anterior circulation atherosclerosis, but no definite stenosis or major branch occlusion correspond to the acute left MCA infarcts. 2.  Right ACA irregularity and decreased flow probably corresponds to a combination of atherosclerosis and congenital anatomic variation. 3.  Essentially negative posterior circulation.  Original Report Authenticated By: Harley Hallmark, M.D.   Mr Maxine Glenn Head/brain Wo Cm  01/25/2012  *RADIOLOGY REPORT*  Clinical Data:  78 year old female with acute onset of expressive aphasia and dizziness.  CT abnormality in the right basal ganglia.  Comparison: Head CT without contrast 01/24/2012.  MRI HEAD WITHOUT CONTRAST  Technique: Multiplanar, multiecho pulse sequences of the brain and surrounding structures were obtained according to standard protocol without intravenous contrast.  Findings: Confluent area of restricted diffusion in the posterior left  temporal lobe and lower left parietal lobe abutting the atrium of the left lateral ventricle (series 4 image 15).  Scattered additional mostly punctate areas of cortical restricted diffusion in the left superior frontal gyrus and left parietal lobe (image 22).  Additionally there is a punctate acute or subacute area of restricted diffusion in the right parietal lobe (image 20).  No other right hemisphere and no posterior fossa diffusion abnormality.  Mild T2 and FLAIR hyperintensity in the acutely affected areas.  No mass effect or hemorrhage. Major intracranial vascular flow voids are preserved, however, there is increased FLAIR signal and some posterior left MCA branches (series 8 image 14), see MRA findings below.  Patchy bilateral cerebral white matter T2 and FLAIR hyperintensity in the periventricular pattern.  Mild to moderate T2 heterogeneity in the deep gray matter nuclei and brain stem.  Occasional tiny chronic lacunar infarcts in the cerebellum.  Negative visualized cervical spine.  Normal bone marrow signal.  Postoperative changes to the globes.  Negative paranasal sinuses and mastoids.  Negative scalp soft tissues.  IMPRESSION: 1.  Acute scattered left MCA infarcts, largest area of involvement in the posterior MCA territory.  No mass effect or hemorrhage. 2.  Superimposed punctate acute or subacute posterior right MCA lacunar infarct.  Legrand Rams this represents a synchronous ischemia (rather than an embolic phenomena). 3.  Underlying chronic small vessel disease. 4.  See MRA findings below.  MRA HEAD WITHOUT CONTRAST  Technique: Angiographic images of the Circle of Willis were obtained using MRA technique without  intravenous contrast.  Findings: Mildly degraded by motion despite repeated imaging attempts.  Antegrade flow in the posterior circulation codominant distal vertebral arteries.  Normal left PICA.  Patent vertebrobasilar junction with mild dolichoectasia.  Patent bilateral AICA vessels. No basilar  stenosis.  SCA and PCA origins are within normal limits. Posterior communicating arteries are diminutive or absent. Bilateral PCA branches are within normal limits allowing for some motion.  Antegrade flow in both ICA siphons.  Irregularity in both cavernous segment probably compounded by susceptibility artifact from the aerated sphenoid sinuses.  No hemodynamically significant ICA stenosis suspected.  Ophthalmic artery origins are within normal limits.  Patent carotid termini.  Dominant left ACA A1 segment.  Nondominant right A1 segment with irregularity and tandem stenoses.  Anterior communicating artery is patent.  Decreased right ACA A2 flow, or persistent nondominant anatomy.  Left MCA M1 segment is mildly irregular but patent.  No stenosis is identified at the bifurcation or involving  the posterior sylvian division.  No major left MCA branch occlusion is identified.  Visualized right MCA branches likewise are within normal limits.  IMPRESSION: 1.  Anterior circulation atherosclerosis, but no definite stenosis or major branch occlusion correspond to the acute left MCA infarcts. 2.  Right ACA irregularity and decreased flow probably corresponds to a combination of atherosclerosis and congenital anatomic variation. 3.  Essentially negative posterior circulation.  Original Report Authenticated By: Harley Hallmark, M.D.    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 Does the need for close, 24 hr/day medical supervision in concert with the patient's rehab needs make it unreasonable for this patient to be served in a less intensive setting? No 2. Co-Morbidities requiring supervision/potential complications: atrial fibrillation 3. Due to safety, medication administration and patient education, does the patient require 24 hr/day rehab nursing?  Potentially 4. Does the patient require coordinated care of a physician, rehab nurse, PT (0.5-1 hrs/day, 5 days/week) and OT (0.5-1 hrs/day, 3 days/week) to address physical and functional deficits in the context of the above medical diagnosis(es)? No Addressing deficits in the following areas: balance, endurance, locomotion, strength, transferring, bathing and dressing 5. Can the patient actively participate in an intensive therapy program of at least 3 hrs of therapy per day at least 5 days per week? Yes 6. The potential for patient to make measurable gains while on inpatient rehab is good for SNF level 7. Anticipated functional outcomes upon discharge from inpatient rehab are supervision mobility with PT, supervision ADLs with OT, express basic needs with 100% accuracy with SLP. 8. Estimated rehab length of stay to reach the above functional goals is: two-week SNF stay 9. Does the patient have adequate social supports to accommodate these discharge functional goals? Yes 10. Anticipated D/C setting: Home 11. Anticipated post D/C treatments: HH therapy 12. Overall Rehab/Functional Prognosis: good  RECOMMENDATIONS: This patient's condition is appropriate for continued rehabilitative care in the following setting: SNF Patient has agreed to participate in recommended program. Potentially Note that insurance prior authorization may be required for reimbursement for recommended care.  Comment:patient is at a very high functional status from a physical standpoint. Daughter is not sure whether family can provide 24 7 supervision. Will look into having the patient go to the same skilled nursing facility where the husband is currently located.    01/26/2012

## 2012-01-26 NOTE — Care Management Note (Signed)
    Page 1 of 1   01/26/2012     10:21:19 AM   CARE MANAGEMENT NOTE 01/26/2012  Patient:  Casey White,Casey White   Account Number:  [id number]  Date Initiated:  01/26/2012  Documentation initiated by:  Onnie Boer  Subjective/Objective Assessment:   PT WAS ADMITTED WITH APHASIA     Action/Plan:   PROGRESSION OF CARE AND DISCHARGE PLANNING   Anticipated DC Date:  01/28/2012   Anticipated DC Plan:  IP REHAB FACILITY  In-house referral  Clinical Social Worker      DC Planning Services  CM consult      Choice offered to / List presented to:             Status of service:  In process, will continue to follow Medicare Important Message given?   (If response is "NO", the following Medicare IM given date fields will be blank) Date Medicare IM given:   Date Additional Medicare IM given:    Discharge Disposition:    Per UR Regulation:  Reviewed for med. necessity/level of care/duration of stay  If discussed at Long Length of Stay Meetings, dates discussed:    Comments:  01/26/12 Onnie Boer, RN, BSN 1015 PT WAS ADMITTED WITH APHASIA.  PTA PT WAS AT HOME ALONE . AWAITING PT/OT EVAL, WILL F/U ON DC NEEDS.

## 2012-01-26 NOTE — Progress Notes (Signed)
RN was notified of abnormal BP  

## 2012-01-26 NOTE — Progress Notes (Addendum)
[facility]                                                                                Patient Demographics  Casey White, is a 78 y.o. female  ZOX:[id number]  [id number]  DOB - 07/29/1927  Admit date - 01/24/2012  Admitting Physician Casey Clos, MD  Outpatient Primary MD for the patient is White,Casey B., MD  LOS - 2   Chief Complaint  Patient presents with  . Code Stroke        Assessment & Plan   78 year old female admitted 2 days ago with CVA causing expressive aphasia, he also has atrial fibrillation and is on Coumadin. Discussed the case with Dr. Satira White neurologist and discontinued her aspirin. PT and speech seeing her, likely need placement per speech therapy who I discussed the case with today bedside.     1. CVA/ Expressive Aphasia: improved. CT head showed focal hypo attenuation int he right basal ganglia possibly subacute infarct. MRI brain showed Acute scattered left MCA infarcts, largest area of involvement in the posterior MCA territory. Superimposed punctate acute or subacute posterior right MCA lacunar infarct. Echo and Carotid Duplex are done and results pending. On Coumadin and Lipitor. Physical therapy and speech following speech therapy recommends 24-hour supervision or placement.  Lab Results  Component Value Date   INR 2.34* 01/26/2012   INR 1.95* 01/25/2012   INR 2.21* 01/24/2012    Lab Results  Component Value Date   HGBA1C 6.0* 01/25/2012    Lab Results  Component Value Date   CHOL 173 01/25/2012   HDL 45 01/25/2012   LDLCALC 107* 01/25/2012   TRIG 105 01/25/2012   CHOLHDL 3.8 01/25/2012    2. Diabetes Mellitus:   Continue on SSI.   CBG (last 3)   Basename 01/24/12 2023  GLUCAP 107*      3. Atrial Fibrillation : rate well controlled. INR is therapeutic. On coumadin.     4. Hypokalemia: will replete as needed. Check labs in am.     5. Abnormal CXR: Nodule Confirmed on CT scan  outpatient pulmonary  followup.    Casey White M.D on 01/26/2012 at 2:35 PM  Between 7am to 7pm - Pager - [phone]  After 7pm go to [email or web address] - password [email or web address]  And look for the night coverage person covering for me after hours  [facility] Office  [phone]    Subjective:   Casey White today has, No headache, No chest pain, No abdominal pain - No Nausea, No new weakness tingling or numbness, No Cough - SOB   Objective:   Filed Vitals:   01/25/12 2300 01/26/12 0200 01/26/12 0600 01/26/12 0958  BP:  107/51 117/75 98/58  Pulse: 60 69 63 68  Temp:  98.9 F (37.2 C) 97.5 F (36.4 C) 97.4 F (36.3 C)  TempSrc:    Oral  Resp:  14 16 18   Height:      Weight:      SpO2:  96% 96% 98%    Wt Readings from Last 3 Encounters:  01/25/12 61.3 kg (135 lb 2.3 oz)     Intake/Output Summary (Last 24 hours) at 01/26/12  1435 Last data filed at 01/26/12 1358  Gross per 24 hour  Intake    340 ml  Output      2 ml  Net    338 ml    Exam Awake Alert, Oriented X 3, No new F.N deficits, Normal affect [place].AT,PERRAL Supple Neck,No JVD, No cervical lymphadenopathy appriciated.  Symmetrical Chest wall movement, Good air movement bilaterally, CTAB RRR,No Gallops,Rubs or new Murmurs, No Parasternal Heave +ve B.Sounds, Abd Soft, Non tender, No organomegaly appriciated, No rebound - guarding or rigidity. No Cyanosis, Clubbing or edema, No new Rash or bruise      Data Review   CBC  Lab 01/26/12 0620 01/25/12 0253 01/24/12 2023 01/24/12 2004  WBC 10.3 9.4 -- 12.1*  HGB 14.0 12.8 15.0 13.7  HCT 42.1 39.3 44.0 42.3  PLT 376 360 -- 398  MCV 85.4 84.5 -- 84.6  MCH 28.4 27.5 -- 27.4  MCHC 33.3 32.6 -- 32.4  RDW 14.2 13.8 -- 13.9  LYMPHSABS -- 3.9 -- 6.8*  MONOABS -- 0.7 -- 0.9  EOSABS -- 0.1 -- 0.3  BASOSABS -- 0.1 -- 0.1  BANDABS -- -- -- --    Chemistries   Lab 01/26/12 0620 01/25/12 0450 01/24/12 2023 01/24/12 2004  NA 142 137 142 139  K 4.5 3.0* 3.2* 3.1*  CL 106 98  101 99  CO2 25 29 -- 29  GLUCOSE 95 96 107* 109*  BUN 15 15 19 18   CREATININE 0.84 0.84 1.20* 1.00  CALCIUM 9.5 9.0 -- 9.1  MG 2.7* -- -- --  AST -- 25 -- 28  ALT -- 9 -- 12  ALKPHOS -- 55 -- 61  BILITOT -- 0.6 -- 0.4   ------------------------------------------------------------------------------------------------------------------ estimated creatinine clearance is 48.3 ml/min (by C-G formula based on Cr of 0.84). ------------------------------------------------------------------------------------------------------------------  [facility] 01/25/12 0450  HGBA1C 6.0*   ------------------------------------------------------------------------------------------------------------------  Basename 01/25/12 0450  CHOL 173  HDL 45  LDLCALC 107*  TRIG 105  CHOLHDL 3.8  LDLDIRECT --   ------------------------------------------------------------------------------------------------------------------  Basename 01/25/12 0450  TSH 4.679*  T4TOTAL --  T3FREE --  THYROIDAB --   ------------------------------------------------------------------------------------------------------------------ No results found for this basename: VITAMINB12:2,FOLATE:2,FERRITIN:2,TIBC:2,IRON:2,RETICCTPCT:2 in the last 72 hours  Coagulation profile  Lab 01/26/12 0620 01/25/12 0450 01/24/12 2004  INR 2.34* 1.95* 2.21*  PROTIME -- -- --    No results found for this basename: DDIMER:2 in the last 72 hours  Cardiac Enzymes  Lab 01/24/12 2005  CKMB 2.1  TROPONINI <0.30  MYOGLOBIN --   ------------------------------------------------------------------------------------------------------------------ No components found with this basename: POCBNP:3  Micro Results No results found for this or any previous visit (from the past 240 hour(s)).  Radiology Reports Dg Chest 2 View  01/25/2012  *RADIOLOGY REPORT*  Clinical Data: Stroke  CHEST - 2 VIEW  Comparison: None.  Findings: Ill-defined density is present  at the right apex. Diffuse interstitial prominence and bronchitic changes.  Mild cardiomegaly.  No pneumothorax or pleural effusion.  Compression fractures of T9 and T12 of indeterminate age are present.  IMPRESSION: Ill-defined right upper lobe opacity.  Comparison with prior studies is recommended.   If none are available, CT can be performed to exclude mass.  T9 and T12 compression fractures of indeterminate age.  Original Report Authenticated By: Donavan Burnet, M.D.   Ct Head Wo Contrast  01/24/2012  *RADIOLOGY REPORT*  Clinical Data: Weakness, confusion  CT HEAD WITHOUT CONTRAST  Technique:  Contiguous axial images were obtained from the base of the skull through the  vertex without contrast.  Comparison: None.  Findings: Focal hypoattenuation in the right basal ganglia may represent subacute infarct. Diffuse parenchymal atrophy. Patchy areas of hypoattenuation in deep and periventricular white matter bilaterally. Negative for acute intracranial hemorrhage, mass lesion, acute infarction, midline shift, or mass-effect. Acute infarct may be inapparent on noncontrast CT. Ventricles and sulci symmetric. Bone windows demonstrate no focal lesion.  IMPRESSION:  1.  Focal hypoattenuation in the right basal ganglia may represent subacute or chronic non hemorrhagic lacunar type infarct. I telephoned the critical test results to Dr. Manus Gunning at the time of interpretation.  2. Atrophy and nonspecific white matter changes.  Original Report Authenticated By: Osa Craver, M.D.   Ct Chest W Contrast  01/25/2012  *RADIOLOGY REPORT*  Clinical Data: Chest pain.  Short of breath.  Abnormal chest x-ray.  CT CHEST WITH CONTRAST  Technique:  Multidetector CT imaging of the chest was performed following the standard protocol during bolus administration of intravenous contrast.  Contrast: 80mL OMNIPAQUE IOHEXOL 300 MG/ML  SOLN  Comparison: 01/25/2012.  Findings: Cardiomegaly.  Aortic atherosclerosis.  No acute aortic  abnormality.  No effusion.  Incidental imaging of the upper abdomen is within normal limits.  Right apical density represents an area of linear scarring.  No discrete mass lesion is identified.  There is no airspace disease. Interlobular septal thickening is present.  Scattered subpleural nodules are present diffusely.  Linear subsegmental atelectasis in the right lower lobe.  8 mm by 5 mm nodule is present in the anterior right middle lobe.  There is an enlarged carinal lymph node measuring 18 mm short axis. Small right hilar lymph node is also present.  These may be congestive or reactive. No aggressive osseous lesions are present. L1 compression fracture with retropulsion appears chronic.  T10 compression fracture is present with minimal retropulsion also appears chronic with endplate sclerosis.  IMPRESSION: 1.  Density in the right upper lobe on chest radiograph represents an area of subpleural scarring. 2.  Scattered subpleural nodules are present, most prominent in the anterior right middle lobe measuring 8 mm x 5 mm. If the patient is at high risk for bronchogenic carcinoma, follow-up chest CT at 6-12 months is recommended.  If the patient is at low risk for bronchogenic carcinoma, follow-up chest CT at 12 months is recommended.  This recommendation follows the consensus statement: Guidelines for Management of Small Pulmonary Nodules Detected on CT Scans: A Statement from the Fleischner Society as published in Radiology 2005; 237:395-400. 3.  Precarinal lymph node measuring 18 mm short axis is nonspecific.  This may be congestive or reactive.  For interlobular septal thickening is present diffusely, which is nonspecific. Potentially this could relate to interstitial pulmonary edema in this patient with cardiomegaly.  Original Report Authenticated By: Andreas Newport, M.D.   Mr Brain Wo Contrast  01/25/2012  *RADIOLOGY REPORT*  Clinical Data:  78 year old female with acute onset of expressive aphasia and  dizziness.  CT abnormality in the right basal ganglia.  Comparison: Head CT without contrast 01/24/2012.  MRI HEAD WITHOUT CONTRAST  Technique: Multiplanar, multiecho pulse sequences of the brain and surrounding structures were obtained according to standard protocol without intravenous contrast.  Findings: Confluent area of restricted diffusion in the posterior left temporal lobe and lower left parietal lobe abutting the atrium of the left lateral ventricle (series 4 image 15).  Scattered additional mostly punctate areas of cortical restricted diffusion in the left superior frontal gyrus and left parietal lobe (image 22).  Additionally  there is a punctate acute or subacute area of restricted diffusion in the right parietal lobe (image 20).  No other right hemisphere and no posterior fossa diffusion abnormality.  Mild T2 and FLAIR hyperintensity in the acutely affected areas.  No mass effect or hemorrhage. Major intracranial vascular flow voids are preserved, however, there is increased FLAIR signal and some posterior left MCA branches (series 8 image 14), see MRA findings below.  Patchy bilateral cerebral white matter T2 and FLAIR hyperintensity in the periventricular pattern.  Mild to moderate T2 heterogeneity in the deep gray matter nuclei and brain stem.  Occasional tiny chronic lacunar infarcts in the cerebellum.  Negative visualized cervical spine.  Normal bone marrow signal.  Postoperative changes to the globes.  Negative paranasal sinuses and mastoids.  Negative scalp soft tissues.  IMPRESSION: 1.  Acute scattered left MCA infarcts, largest area of involvement in the posterior MCA territory.  No mass effect or hemorrhage. 2.  Superimposed punctate acute or subacute posterior right MCA lacunar infarct.  Legrand Rams this represents a synchronous ischemia (rather than an embolic phenomena). 3.  Underlying chronic small vessel disease. 4.  See MRA findings below.  MRA HEAD WITHOUT CONTRAST  Technique: Angiographic  images of the Circle of Willis were obtained using MRA technique without  intravenous contrast.  Findings: Mildly degraded by motion despite repeated imaging attempts.  Antegrade flow in the posterior circulation codominant distal vertebral arteries.  Normal left PICA.  Patent vertebrobasilar junction with mild dolichoectasia.  Patent bilateral AICA vessels. No basilar stenosis.  SCA and PCA origins are within normal limits. Posterior communicating arteries are diminutive or absent. Bilateral PCA branches are within normal limits allowing for some motion.  Antegrade flow in both ICA siphons.  Irregularity in both cavernous segment probably compounded by susceptibility artifact from the aerated sphenoid sinuses.  No hemodynamically significant ICA stenosis suspected.  Ophthalmic artery origins are within normal limits.  Patent carotid termini.  Dominant left ACA A1 segment.  Nondominant right A1 segment with irregularity and tandem stenoses.  Anterior communicating artery is patent.  Decreased right ACA A2 flow, or persistent nondominant anatomy.  Left MCA M1 segment is mildly irregular but patent.  No stenosis is identified at the bifurcation or involving the posterior sylvian division.  No major left MCA branch occlusion is identified.  Visualized right MCA branches likewise are within normal limits.  IMPRESSION: 1.  Anterior circulation atherosclerosis, but no definite stenosis or major branch occlusion correspond to the acute left MCA infarcts. 2.  Right ACA irregularity and decreased flow probably corresponds to a combination of atherosclerosis and congenital anatomic variation. 3.  Essentially negative posterior circulation.  Original Report Authenticated By: Harley Hallmark, M.D.   Mr Maxine Glenn Head/brain Wo Cm  01/25/2012  *RADIOLOGY REPORT*  Clinical Data:  78 year old female with acute onset of expressive aphasia and dizziness.  CT abnormality in the right basal ganglia.  Comparison: Head CT without contrast  01/24/2012.  MRI HEAD WITHOUT CONTRAST  Technique: Multiplanar, multiecho pulse sequences of the brain and surrounding structures were obtained according to standard protocol without intravenous contrast.  Findings: Confluent area of restricted diffusion in the posterior left temporal lobe and lower left parietal lobe abutting the atrium of the left lateral ventricle (series 4 image 15).  Scattered additional mostly punctate areas of cortical restricted diffusion in the left superior frontal gyrus and left parietal lobe (image 22).  Additionally there is a punctate acute or subacute area of restricted diffusion in the right parietal  lobe (image 20).  No other right hemisphere and no posterior fossa diffusion abnormality.  Mild T2 and FLAIR hyperintensity in the acutely affected areas.  No mass effect or hemorrhage. Major intracranial vascular flow voids are preserved, however, there is increased FLAIR signal and some posterior left MCA branches (series 8 image 14), see MRA findings below.  Patchy bilateral cerebral white matter T2 and FLAIR hyperintensity in the periventricular pattern.  Mild to moderate T2 heterogeneity in the deep gray matter nuclei and brain stem.  Occasional tiny chronic lacunar infarcts in the cerebellum.  Negative visualized cervical spine.  Normal bone marrow signal.  Postoperative changes to the globes.  Negative paranasal sinuses and mastoids.  Negative scalp soft tissues.  IMPRESSION: 1.  Acute scattered left MCA infarcts, largest area of involvement in the posterior MCA territory.  No mass effect or hemorrhage. 2.  Superimposed punctate acute or subacute posterior right MCA lacunar infarct.  Legrand Rams this represents a synchronous ischemia (rather than an embolic phenomena). 3.  Underlying chronic small vessel disease. 4.  See MRA findings below.  MRA HEAD WITHOUT CONTRAST  Technique: Angiographic images of the Circle of Willis were obtained using MRA technique without  intravenous contrast.   Findings: Mildly degraded by motion despite repeated imaging attempts.  Antegrade flow in the posterior circulation codominant distal vertebral arteries.  Normal left PICA.  Patent vertebrobasilar junction with mild dolichoectasia.  Patent bilateral AICA vessels. No basilar stenosis.  SCA and PCA origins are within normal limits. Posterior communicating arteries are diminutive or absent. Bilateral PCA branches are within normal limits allowing for some motion.  Antegrade flow in both ICA siphons.  Irregularity in both cavernous segment probably compounded by susceptibility artifact from the aerated sphenoid sinuses.  No hemodynamically significant ICA stenosis suspected.  Ophthalmic artery origins are within normal limits.  Patent carotid termini.  Dominant left ACA A1 segment.  Nondominant right A1 segment with irregularity and tandem stenoses.  Anterior communicating artery is patent.  Decreased right ACA A2 flow, or persistent nondominant anatomy.  Left MCA M1 segment is mildly irregular but patent.  No stenosis is identified at the bifurcation or involving the posterior sylvian division.  No major left MCA branch occlusion is identified.  Visualized right MCA branches likewise are within normal limits.  IMPRESSION: 1.  Anterior circulation atherosclerosis, but no definite stenosis or major branch occlusion correspond to the acute left MCA infarcts. 2.  Right ACA irregularity and decreased flow probably corresponds to a combination of atherosclerosis and congenital anatomic variation. 3.  Essentially negative posterior circulation.  Original Report Authenticated By: Ulla Potash III, M.D.    Scheduled Meds:   . atorvastatin  20 mg Oral q1800  . potassium chloride  10 mEq Intravenous Q1 Hr x 2  . potassium chloride  40 mEq Oral BID  . warfarin  2.5 mg Oral Q T,Th,S,Su-1800  . warfarin  5 mg Oral ONCE-1800  . warfarin  5 mg Oral Q M,W,F-1800  . Warfarin - Pharmacist Dosing Inpatient   Does not apply q1800    . DISCONTD: aspirin  300 mg Rectal Daily  . DISCONTD: aspirin  325 mg Oral Daily   Continuous Infusions:   . sodium chloride 75 mL/hr at 01/25/12 0112   PRN Meds:.iohexol, senna-docusate

## 2012-01-26 NOTE — Evaluation (Signed)
Speech Language Pathology Evaluation Patient Details Name: Anthonella Klausner MRN: [id number] DOB: [birth year] Today's Date: 01/26/2012 Time: 4098-1191 SLP Time Calculation (min): 40 min  Problem List:  Patient Active Problem List  Diagnosis  . Expressive aphasia  . H/O TIA (transient ischemic attack) and stroke  . Atrial fibrillation  . Hypokalemia  . Abnormal CXR   Past Medical History:  Past Medical History  Diagnosis Date  . TIA (transient ischemic attack)   . Dysrhythmia    Past Surgical History:  Past Surgical History  Procedure Date  . Abdominal hysterectomy   . Appendectomy    HPI:  Ms. Cici Rodriges is a 78 y.o. female presenting with expressive aphasia . Imaging confirms bilateral MCA infarcts, mainly in left parietal region, likely due to cardioembolic source (atrial fibrillation) inspite of therapeutic warfarin, INR 2.2 . Remains on on warfarin for secondary stroke prevention. Patient with resultant aphasia and right visual field defect.   Assessment / Plan / Recommendation Clinical Impression  Pt presents with moderate cognitive-linguisitc deficits including aphasia (fluent, anomia in spontaneous speech with phonemic and semantic paraphasias with intermittent awareness/self correction), auditory comprehension deficits with complex information/commands, perseveration. Safety signficnantly impaired by visual field cuts, impaired sustained attention, poor basic functional problem solving, limited awareness of deficits, impaired short term memory.  Pt will need acute rehab and would recommend CIR at d/c if possible to improve safety and increase independence with basic verbal and functional tasks. Pt will need full supervision.     SLP Assessment  Patient needs continued Speech Lanaguage Pathology Services    Follow Up Recommendations  Inpatient Rehab    Frequency and Duration min 2x/week  2 weeks   Pertinent Vitals/Pain NA   SLP Goals  SLP Goals Potential to Achieve  Goals: Good SLP Goal #1: Pt will correct paraphasias with moderate verbal/articulatory cues at conversation level x 10.  SLP Goal #2: Pt will demonstrate emergent awareness of deficits with min contextual cues x3.  SLP Goal #3: Pt will complete basic functional problem solving task through completion with moderate contextual cues.   SLP Evaluation Prior Functioning  Cognitive/Linguistic Baseline: Information not available Type of Home: House Lives With: Family (Grandchildren?, husband at SNF, daughter next door) Available Help at Discharge: Family;Available 24 hours/day Vocation: Retired   IT consultant  Overall Cognitive Status: Impaired Arousal/Alertness: Awake/alert Orientation Level: Oriented to person;Disoriented to place;Disoriented to time;Disoriented to situation Attention: Focused;Sustained;Selective Focused Attention: Appears intact Sustained Attention: Impaired Sustained Attention Impairment: Functional basic;Verbal basic Selective Attention: Impaired Selective Attention Impairment: Verbal basic;Functional basic Memory: Impaired Memory Impairment: Storage deficit;Retrieval deficit;Decreased recall of new information;Decreased short term memory;Prospective memory Decreased Short Term Memory: Verbal basic;Functional basic Awareness: Impaired Awareness Impairment: Intellectual impairment;Emergent impairment;Anticipatory impairment (aware of errors less than 50% of time) Problem Solving: Impaired Problem Solving Impairment: Verbal basic;Functional basic Behaviors: Impulsive;Perseveration Safety/Judgment: Impaired    Comprehension  Auditory Comprehension Overall Auditory Comprehension: Impaired Yes/No Questions: Within Functional Limits Commands: Impaired One Step Basic Commands: 50-74% accurate Two Step Basic Commands: 0-24% accurate Conversation: Simple Interfering Components: Attention;Visual impairments;Working Engineer, agricultural: Within International aid/development worker Expression Overall Verbal Expression: Impaired Initiation: No impairment Automatic Speech: Name;Social Response;Day of week Level of Generative/Spontaneous Verbalization: Conversation Repetition: Impaired Level of Impairment: Phrase level Naming: Impairment Responsive: 76-100% accurate Confrontation: Within functional limits Convergent: 0-24% accurate Divergent: 75-100% accurate Other Naming Comments: anomia and paraphasias evident with spontaneous speech, multisyllabic words Verbal Errors: Semantic paraphasias;Phonemic paraphasias;Perseveration;Other (comment) (aware of errors less than  50% of time) Pragmatics: No impairment Interfering Components: Attention Effective Techniques: Articulatory cues;Semantic cues   Oral / Motor Oral Motor/Sensory Function Overall Oral Motor/Sensory Function: Impaired Labial ROM: Reduced left Labial Symmetry: Abnormal symmetry left Labial Strength: Reduced Labial Sensation: Within Functional Limits Lingual ROM: Within Functional Limits Lingual Symmetry: Within Functional Limits Lingual Strength: Within Functional Limits Facial ROM: Reduced left Facial Symmetry: Left droop Motor Speech Overall Motor Speech: Appears within functional limits for tasks assessed   GO     Ilamae Geng, Riley Nearing 01/26/2012, 1:57 PM

## 2012-01-26 NOTE — Progress Notes (Signed)
Stroke Team Progress Note  HISTORY Casey White is an 78 y.o. female with a history of TIA and stroke as well as atrial fibrillation on Coumadin, who developed acute onset of speech output difficulty at 6:55 PM 01/24/2012. Patient apparently can understand what was being said to her but had difficulty getting out what she wanted to say. No focal weakness was noted. CT scan of her head showed a subacute to chronic small right low density area consistent with infarction. There were no acute intracranial findings. INR was 2.2. Patient improved after arriving in the emergency room but still had significant word finding difficulty and frequent paraphasic errors. Patient was not a TPA candidate secondary to elevated INR on coumadin. She was admitted  for further evaluation and treatment.  SUBJECTIVE No complaints. Family at bedside. Feels better than yesterday.  OBJECTIVE Most recent Vital Signs: Filed Vitals:   01/25/12 2300 01/26/12 0200 01/26/12 0600 01/26/12 0958  BP:  107/51 117/75 98/58  Pulse: 60 69 63 68  Temp:  98.9 F (37.2 C) 97.5 F (36.4 C) 97.4 F (36.3 C)  TempSrc:    Oral  Resp:  14 16 18   Height:      Weight:      SpO2:  96% 96% 98%   CBG (last 3)   Basename 01/24/12 2023  GLUCAP 107*   Intake/Output from previous day: 08/12 0701 - 08/13 0700 In: 580 [P.O.:580] Out: -   IV Fluid Intake:      . sodium chloride 75 mL/hr at 01/25/12 0112   MEDICATIONS     . atorvastatin  20 mg Oral q1800  . potassium chloride  10 mEq Intravenous Q1 Hr x 2  . potassium chloride  40 mEq Oral BID  . warfarin  2.5 mg Oral Q T,Th,S,Su-1800  . warfarin  5 mg Oral ONCE-1800  . warfarin  5 mg Oral Q M,W,F-1800  . Warfarin - Pharmacist Dosing Inpatient   Does not apply q1800  . DISCONTD: aspirin  300 mg Rectal Daily  . DISCONTD: aspirin  325 mg Oral Daily   PRN:  iohexol, senna-docusate  Diet:  Cardiac thin liquids Activity:  OOB with assistance DVT Prophylaxis:   coumadin  CLINICALLY SIGNIFICANT STUDIES Basic Metabolic Panel:   Lab 01/26/12 0620 01/25/12 0450  NA 142 137  K 4.5 3.0*  CL 106 98  CO2 25 29  GLUCOSE 95 96  BUN 15 15  CREATININE 0.84 0.84  CALCIUM 9.5 9.0  MG 2.7* --  PHOS -- --   Liver Function Tests:   Lab 01/25/12 0450 01/24/12 2004  AST 25 28  ALT 9 12  ALKPHOS 55 61  BILITOT 0.6 0.4  PROT 7.1 7.7  ALBUMIN 3.3* 3.6   CBC:   Lab 01/26/12 0620 01/25/12 0253 01/24/12 2004  WBC 10.3 9.4 --  NEUTROABS -- 4.7 4.0  HGB 14.0 12.8 --  HCT 42.1 39.3 --  MCV 85.4 84.5 --  PLT 376 360 --   Coagulation:   Lab 01/26/12 0620 01/25/12 0450 01/24/12 2004  LABPROT 26.0* 22.6* 24.9*  INR 2.34* 1.95* 2.21*   Cardiac Enzymes:   Lab 01/24/12 2005  CKTOTAL 105  CKMB 2.1  CKMBINDEX --  TROPONINI <0.30   Urinalysis:   Lab 01/24/12 2147  COLORURINE YELLOW  LABSPEC 1.008  PHURINE 7.0  GLUCOSEU NEGATIVE  HGBUR TRACE*  BILIRUBINUR NEGATIVE  KETONESUR NEGATIVE  PROTEINUR NEGATIVE  UROBILINOGEN 1.0  NITRITE NEGATIVE  LEUKOCYTESUR SMALL*   Lipid Panel  Component Value Date/Time   CHOL 173 01/25/2012 0450   TRIG 105 01/25/2012 0450   HDL 45 01/25/2012 0450   CHOLHDL 3.8 01/25/2012 0450   VLDL 21 01/25/2012 0450   LDLCALC 107* 01/25/2012 0450   HgbA1C  Lab Results  Component Value Date   HGBA1C 6.0* 01/25/2012   Urine Drug Screen:   No results found for this basename: labopia,  cocainscrnur,  labbenz,  amphetmu,  thcu,  labbarb    Alcohol Level: No results found for this basename: ETH:2 in the last 168 hours   CT of the brain  01/24/2012   1.  Focal hypoattenuation in the right basal ganglia may represent subacute or chronic non hemorrhagic lacunar type infarct. I telephoned the critical test results to Dr. Manus Gunning at the time of interpretation.  2. Atrophy and nonspecific white matter changes.   MRI of the brain  01/25/2012   1.  Acute scattered left MCA infarcts, largest area of involvement in the posterior  left MCA territory.  No mass effect or hemorrhage. 2.  Superimposed punctate acute or subacute posterior right MCA lacunar infarct.    MRA of the brain  01/25/2012 1.  Anterior circulation atherosclerosis, but no definite stenosis or major branch occlusion correspond to the acute left MCA infarcts. 2.  Right ACA irregularity and decreased flow probably corresponds to a combination of atherosclerosis and congenital anatomic variation. 3.  Essentially negative posterior circulation.    2D Echocardiogram  EF 40-45% with no source of embolus.  There is akinesis of the distal inferoseptal anteroseptal and apical myocardium.   Carotid Doppler No internal carotid artery stenosis bilaterally. Vertebrals with antegrade flow bilaterally.   CXR    EKG  atrial fibrillation, rate 78.   Therapy Recommendations PT - CIR ; OT - IP rehab ; ST -   GENERAL EXAM: Patient is in no distress  CARDIOVASCULAR: Regular rate and rhythm, no murmurs, no carotid bruits  NEUROLOGIC: MENTAL STATUS: awake, alert; NAMES COCA-COLA, STRAW AND THUMB. FOLLOWS COMMANDS. CRANIAL NERVE: pupils equal and reactive to light, visual fields NOTABLE FOR RIGHT HEMIANOPSIA. Extraocular muscles intact, no nystagmus, facial sensation and strength symmetric, uvula midline, shoulder shrug symmetric, tongue midline. MOTOR: normal bulk and tone, SYMM strength in the BUE, BLE. SENSORY: normal and symmetric to light touch; NO EXTINCTION  ASSESSMENT [name] is a 78 y.o. female presenting with expressive aphasia . Imaging confirms bilateral MCA infarcts, mainly in left parietal region, likely due to cardioembolic source (atrial fibrillation) inspite of therapeutic warfarin, INR 2.2 (risk for stroke on therapeutic warfarin ~60%) . Remains on on warfarin for secondary stroke prevention. Patient with resultant aphasia and right visual field defect. Inpatient rehab recommended by therapy.  -atrial fibrillation, on coumadin PTA. INR  therapeutic -hx TIA -hyperlipidemia, LDL 107 on lipitor  Hospital day # 2  TREATMENT/PLAN -Continue warfarin for ongoing secondary stroke prevention. No indication for anticoagulation adjustment. -discontinue Aspirin due to bleeding risk -rehab consult. If SNF recommended, pt's husband in [facility] in [place] currently, and family would prefer she go there as well. -Stroke Service will sign off. Follow up with Dr. Pearlean Brownie in [facility] in 2 months.   Annie Main, MSN, RN, ANVP-BC, ANP-BC, [facility] Pager: [phone] 01/26/2012 3:22 PM  Scribe for Dr. Joycelyn Schmid who has personally reviewed chart, pertinent data, examined the patient and developed the plan of care.  Pager:  [phone]   [facility] - Stroke Team Joycelyn Schmid, MD 01/26/2012, 3:22  PM   Please refer to [email or web address] for on-call Stroke MD

## 2012-01-26 NOTE — Evaluation (Addendum)
Physical Therapy Evaluation Patient Details Name: Casey White MRN: [id number] DOB: [birth year] Today's Date: 01/26/2012 Time: 4098-1191 PT Time Calculation (min): 32 min  PT Assessment / Plan / Recommendation Clinical Impression  Pt adm with aphasia with MRI showing acute scattered left MCA infarcts and subacute posterior Rt MCA infarct. Pt with dependencies in mobility, including ? Rt field visual deficit and will benefit from PT to incr safety. Unclear degree of support available on d/c home, but will need post-acute inpatient therapies.    PT Assessment  Patient needs continued PT services    Follow Up Recommendations  Inpatient Rehab;Supervision/Assistance - 24 hour    Barriers to Discharge Decreased caregiver support      Equipment Recommendations  Defer to next venue    Recommendations for Other Services Rehab consult   Frequency Min 4X/week    Precautions / Restrictions Precautions Precautions: Fall Precaution Comments: Pt appears to have a Right visual field cut- will need to continue to assess (inconsistently seeing things on the Rt) Restrictions Weight Bearing Restrictions: No   Pertinent Vitals/Pain Denied pain      Mobility  Bed Mobility Bed Mobility: Supine to Sit;Sitting - Scoot to Edge of Bed;Sit to Supine Supine to Sit: 5: Supervision;HOB flat Sitting - Scoot to Edge of Bed: 5: Supervision Sit to Supine: 6: Modified independent (Device/Increase time);HOB flat Details for Bed Mobility Assistance: supervision for safety due to decr safety awareness Transfers Transfers: Sit to Stand;Stand to Sit Sit to Stand: 4: Min guard;With upper extremity assist;From bed;From chair/3-in-1;From toilet Stand to Sit: 4: Min guard;With upper extremity assist;With armrests;To bed;To chair/3-in-1;To toilet Details for Transfer Assistance: On first transfer, pt with posterior and Lt lean requiring assist to maintain balance and then asked to sit back down due to dizziness;  repeated 4 other transfers with no LOB or dizziness Ambulation/Gait Ambulation/Gait Assistance: 4: Min guard Ambulation Distance (Feet): 80 Feet Assistive device: None Ambulation/Gait Assistance Details: Pt reaching to hold onto objects; denies use of device PTA; denies furniture walking PTA; very cautious  Gait Pattern: Step-through pattern;Decreased stride length;Right foot flat;Left foot flat;Right flexed knee in stance;Left flexed knee in stance;Shuffle;Trunk flexed Gait velocity: significantly impaired Modified Rankin (Stroke Patients Only) Pre-Morbid Rankin Score: No symptoms Modified Rankin: Moderately severe disability    Exercises     PT Diagnosis: Difficulty walking  PT Problem List: Decreased strength;Decreased activity tolerance;Decreased balance;Decreased mobility;Decreased cognition;Decreased knowledge of use of DME;Decreased safety awareness;Impaired sensation PT Treatment Interventions: DME instruction;Gait training;Functional mobility training;Therapeutic activities;Balance training;Neuromuscular re-education;Cognitive remediation;Patient/family education   PT Goals Acute Rehab PT Goals PT Goal Formulation: With patient Time For Goal Achievement: 02/02/12 Potential to Achieve Goals: Good Pt will go Supine/Side to Sit: with modified independence;with HOB 0 degrees;Other (comment) (demonstrating good safety judgement) PT Goal: Supine/Side to Sit - Progress: Goal set today Pt will go Sit to Supine/Side: Independently;with HOB 0 degrees PT Goal: Sit to Supine/Side - Progress: Goal set today Pt will go Sit to Stand: with supervision;without upper extremity assist;Other (comment) (with good safety judgement) PT Goal: Sit to Stand - Progress: Goal set today Pt will Stand: with supervision;1 - 2 min;with no upper extremity support (no incr sway or stepping needed) PT Goal: Stand - Progress: Goal set today Pt will Ambulate: 51 - 150 feet;with supervision;with least restrictive  assistive device  Visit Information  Last PT Received On: 01/26/12 Assistance Needed: +1    Subjective Data  Subjective: "I need to get home to take care of..." and then pt couldn't find the  word (husband) Patient Stated Goal: go home   Prior Functioning  Home Living Lives With: Spouse;Other (Comment) (17 and 89 yo grandsons) Available Help at Discharge: Family;Available 24 hours/day Type of Home: House Home Access: Ramped entrance Home Layout: One level Bathroom Shower/Tub: Engineer, manufacturing systems: Standard Home Adaptive Equipment: Tub transfer bench Additional Comments: two grandsons (17 and 19y/o) live with pt. Grandsons are capable of helping, but will be in school soon. Pt's husband is currently in SNF but was scheduled to have a "trial run" at home on Wed 8/14. Wife is primary caregiver for her husband who requires assist for majority of ADLs Prior Function Level of Independence: Independent Able to Take Stairs?: Reciprically Driving: Yes Vocation: Retired Musician: Expressive difficulties Dominant Hand: Right    Cognition  Overall Cognitive Status: Impaired Area of Impairment: Safety/judgement Arousal/Alertness: Awake/alert Orientation Level: Time;Situation Behavior During Session: WFL for tasks performed Safety/Judgement: Decreased awareness of safety precautions;Decreased awareness of need for assistance (per RN, pt attempting to get up alone)    Extremity/Trunk Assessment Right Lower Extremity Assessment RLE ROM/Strength/Tone: Deficits RLE ROM/Strength/Tone Deficits: AROM WFL; knee extension 4/5, knee flexion 3+/5, DF 5/5 RLE Sensation: Deficits RLE Sensation Deficits: diminished light touch compared to Lt (per pt) RLE Coordination: WFL - gross motor Left Lower Extremity Assessment LLE ROM/Strength/Tone: Deficits LLE ROM/Strength/Tone Deficits: AROM WFL; knee extension 3+/5, knee flexion 3/5, DF 5/5 LLE Sensation: WFL - Light  Touch LLE Coordination: WFL - gross motor Trunk Assessment Trunk Assessment: Kyphotic   Balance Balance Balance Assessed: Yes Static Standing Balance Static Standing - Balance Support: No upper extremity supported Static Standing - Level of Assistance: 4: Min assist  End of Session PT - End of Session Equipment Utilized During Treatment: Gait belt Activity Tolerance: Patient limited by fatigue Patient left: in bed;with call bell/phone within reach;with bed alarm set;Other (comment) (per RN, no chair alarms available and pt to return to bed) Nurse Communication: Mobility status  GP     Kalai Baca 01/26/2012, 10:10 AM Pager [phone]

## 2012-01-27 LAB — PROTIME-INR
INR: 2.96 — ABNORMAL HIGH (ref 0.00–1.49)
Prothrombin Time: 31.3 seconds — ABNORMAL HIGH (ref 11.6–15.2)

## 2012-01-27 MED ORDER — WARFARIN SODIUM 2.5 MG PO TABS
2.5000 mg | ORAL_TABLET | Freq: Once | ORAL | Status: DC
  Filled 2012-01-27: qty 1

## 2012-01-27 MED ORDER — SENNOSIDES-DOCUSATE SODIUM 8.6-50 MG PO TABS: 1.0000 | ORAL_TABLET | Freq: Every evening | ORAL | Status: DC | PRN

## 2012-01-27 MED ORDER — ATORVASTATIN CALCIUM 20 MG PO TABS: 20.0000 mg | ORAL_TABLET | Freq: Every day | ORAL | Status: DC

## 2012-01-27 NOTE — Progress Notes (Signed)
RN was notified of abnormal BP  

## 2012-01-27 NOTE — Progress Notes (Signed)
Physical Therapy Treatment Patient Details Name: Casey White MRN: [id number] DOB: 14-Feb-1928 Today's Date: 01/27/2012 Time: 1478-2956 PT Time Calculation (min): 17 min  PT Assessment / Plan / Recommendation Comments on Treatment Session  Pt adm with aphasia with MRI showing acute scattered left MCA infarcts and subacute posterior Rt MCA infarct. Pt primarily limited by decr vision (Lt eye blindness and now Rt field cut). At times impulsive and moving quickly to get OOB, and at other times very cautious. Agreeable to go for continued therapies at SNF. Daughter and grandson present throughout session.    Follow Up Recommendations  Skilled nursing facility    Barriers to Discharge        Equipment Recommendations  Defer to next venue    Recommendations for Other Services    Frequency Min 3X/week   Plan Discharge plan needs to be updated;Frequency needs to be updated    Precautions / Restrictions Precautions Precautions: Fall Precaution Comments: Rt visual field cut   Pertinent Vitals/Pain Denied pain; per RN, pt with fast "fluttery" rhythm per telemetry while up walking    Mobility  Bed Mobility Bed Mobility: Left Sidelying to Sit;Sitting - Scoot to Edge of Bed Left Sidelying to Sit: 5: Supervision;HOB flat Sitting - Scoot to Edge of Bed: 7: Independent Details for Bed Mobility Assistance: supervision for safety due to decr safety awareness Transfers Transfers: Sit to Stand;Stand to Sit Sit to Stand: 4: Min guard;With upper extremity assist;From bed;With armrests;From chair/3-in-1 Stand to Sit: 4: Min guard;With upper extremity assist;With armrests;To chair/3-in-1 Details for Transfer Assistance: x 2; pt without LOB this date; supervision and cues to align herself in front of chair (pt too far to the left of the chair); minguard for safety Ambulation/Gait Ambulation/Gait Assistance: 4: Min assist Ambulation Distance (Feet): 180 Feet Assistive device: None Ambulation/Gait  Assistance Details: cautious; difficulty seeing objects in her path until she is right upon them (even with cues to look for items, she is unable to find them); able to incr speed with vc, yet does not maintain' Gait Pattern: Step-through pattern;Decreased stride length;Right foot flat;Left foot flat;Right flexed knee in stance;Left flexed knee in stance;Shuffle;Trunk flexed Gait velocity: significantly impaired Modified Rankin (Stroke Patients Only) Pre-Morbid Rankin Score: No symptoms Modified Rankin: Moderately severe disability    Exercises     PT Diagnosis:    PT Problem List:   PT Treatment Interventions:     PT Goals Acute Rehab PT Goals Pt will go Supine/Side to Sit: with modified independence;with HOB 0 degrees;Other (comment) PT Goal: Supine/Side to Sit - Progress: Progressing toward goal Pt will go Sit to Stand: with supervision;without upper extremity assist;Other (comment) PT Goal: Sit to Stand - Progress: Progressing toward goal Pt will Stand: with supervision;1 - 2 min;with no upper extremity support PT Goal: Stand - Progress: Progressing toward goal Pt will Ambulate: 51 - 150 feet;with supervision;with least restrictive assistive device PT Goal: Ambulate - Progress: Progressing toward goal  Visit Information  Last PT Received On: 01/27/12 Assistance Needed: +1    Subjective Data  Subjective: Agrees her vision is different than before she came to the hospital Patient Stated Goal: go home   Cognition  Overall Cognitive Status: Impaired Area of Impairment: Safety/judgement Arousal/Alertness: Awake/alert Behavior During Session: Other (comment) (slightly impulsive) Safety/Judgement: Decreased awareness of need for assistance;Impulsive    Balance  Balance Balance Assessed: Yes Static Standing Balance Static Standing - Balance Support: No upper extremity supported Static Standing - Level of Assistance: 5: Stand by assistance Rhomberg -  Eyes Opened: 30  Rhomberg  - Eyes Closed: 30  Dynamic Standing Balance Dynamic Standing - Balance Support: No upper extremity supported Dynamic Standing - Level of Assistance: 4: Min assist Dynamic Standing - Comments: turning head and shoulders to try to look behind her and heelraises without loss of balance. pt very cautious with these activities  End of Session PT - End of Session Equipment Utilized During Treatment: Gait belt Activity Tolerance: Patient tolerated treatment well Patient left: in chair;with call bell/phone within reach;with family/visitor present Nurse Communication: Mobility status   GP     Casey White 01/27/2012, 10:44 AM Pager [phone]

## 2012-01-27 NOTE — Discharge Summary (Signed)
Physician Discharge Summary  Faline Langer ZHY:[id number] DOB: 1928/01/08 DOA: 01/24/2012  PCP: Ignatius Specking., MD  Admit date: 01/24/2012 Discharge date: 01/27/2012  Recommendations for Outpatient Follow-up:  1. Follow up with Primary MD, SNF MD, and with Dr Delia Heady, neurologist.  2.   Primary MD recommended to arrange follow up CT scan for pulmonary nodules, in 6-12 months. 3.   Pulmonary follow up with Dr Ian Malkin has been arranged.   Discharge Diagnoses:  Principal Problem:  *Expressive aphasia Active Problems:  H/O TIA (transient ischemic attack) and stroke  Atrial fibrillation  Hypokalemia  Abnormal CXR  Embolic cerebral infarction   Discharge Condition: Satisfactory.  Diet recommendation: Heart-healthy.   Filed Weights   01/25/12 0010  Weight: 61.3 kg (135 lb 2.3 oz)    History of present illness:   78 year-old female with history of atrial fibrillation on Coumadin and previous history of TIA had gone to see her husband had nursing home at around 6:45 PM when she suddenly developed speech difficulties when she was found to expressive dysphasia. She was deemed not a T-PA candidate, secondary to elevated INR on coumadin, and was admitted for further evaluation and treatment.   Hospital Course:  1. 1. CVA/ Expressive Aphasia: Patient presented with sudden onset expressive dysphasia. CT head showed focal hypo attenuation in the right basal gangli, consistent with subacute infarct. MRI brain showed acute scattered left MCA infarcts, largest area of involvement in the posterior MCA territory. Superimposed punctate acute or subacute posterior right MCA lacunar infarct. Brain MRA showed anterior circulation atherosclerosis, but no definite stenosis or major branch occlusion correspond to the acute left MCA infarcts. Right ACA irregularity and decreased flow probably corresponds to a combination of atherosclerosis and congenital anatomic variation. 3. Essentially negative posterior  circulation. 2D Echocardiogram showed EF 40-45% with no source of embolus. There is akinesis of the distal inferoseptal anteroseptal and apical myocardium and Carotid Doppler showed no internal carotid artery stenosis bilaterally. Vertebrals with antegrade flow bilaterally. Patient was continued on Coumadin and Lipitor. Physical therapist has recommended SNF placement.  2. Atrial Fibrillation : This has remained rate-controlled. rate well controlled. INR is therapeutic. On Coumadin.  3. Hypokalemia: This was repleted as indicated.  4. Pulmonary nodules: Patient's CXR on 01/25/12, revealed Ill-defined right upper lobe opacity Nodule. Follow up CT scan confirmed scattered subpleural nodules, most prominent in the anterior right middle lobe measuring 8 mm x 5 mm. Follow-up chest CT at 6-12 months is recommended. This has been deferred to patient's primary MD, for follow up.     Procedures:  See below  Consultations:  Dr Noel Christmas, neurologist.  Dr Claudette Laws, Physical medicine and rehab.   Discharge Exam: Filed Vitals:   01/27/12 1040  BP: 113/87  Pulse: 64  Temp: 97.6 F (36.4 C)  Resp: 18   Filed Vitals:   01/26/12 2156 01/27/12 0203 01/27/12 0634 01/27/12 1040  BP: 107/63 117/78 111/81 113/87  Pulse: 110 60 46 64  Temp: 98.4 F (36.9 C) 98.4 F (36.9 C) 98.5 F (36.9 C) 97.6 F (36.4 C)  TempSrc: Oral Oral Oral Oral  Resp: 18 18 16 18   Height:      Weight:      SpO2: 97% 98% 92% 95%   General: Comfortable, alert, communicative, fully oriented, not short of breath at rest.  HEENT:  No clinical pallor, no jaundice, no conjunctival injection or discharge. Hydration status is satisfactory.  NECK:  Supple, JVP not seen, no carotid bruits, no palpable  lymphadenopathy, no palpable goiter. CHEST:  Clinically clear to auscultation, no wheezes, no crackles. HEART:  Sounds 1 and 2 heard, normal, irregular, no murmurs. ABDOMEN:  Full, soft, non-tender, no palpable  organomegaly, no palpable masses, normal bowel sounds. GENITALIA:  Not examined. LOWER EXTREMITIES:  No pitting edema, palpable peripheral pulses. MUSCULOSKELETAL SYSTEM:  Generalized osteoarthritic changes, otherwise, normal. CENTRAL NERVOUS SYSTEM:  Minimal neurologic deficit on gross examination.   Discharge Instructions  Discharge Orders    Future Orders Please Complete By Expires   Diet - low sodium heart healthy      Increase activity slowly        Medication List  As of 01/27/2012  2:45 PM   STOP taking these medications         furosemide 40 MG tablet         TAKE these medications         atorvastatin 20 MG tablet   Commonly known as: LIPITOR   Take 1 tablet (20 mg total) by mouth daily at 6 PM.      CALCIUM PO   Take 1 tablet by mouth daily.      metoprolol succinate 50 MG 24 hr tablet   Commonly known as: TOPROL-XL   Take 50 mg by mouth daily. Take with or immediately following a meal.      senna-docusate 8.6-50 MG per tablet   Commonly known as: Senokot-S   Take 1 tablet by mouth at bedtime as needed.      traMADol-acetaminophen 37.5-325 MG per tablet   Commonly known as: ULTRACET   Take 1-2 tablets by mouth every 6 (six) hours as needed. For pain      warfarin 5 MG tablet   Commonly known as: COUMADIN   Take 2.5-5 mg by mouth. Takes 5 mg on Monday, Wednesday , & Fridays and 2.5 mg all the other days of the week.           Follow-up Information    Follow up with VYAS,DHRUV B., MD. Schedule an appointment as soon as possible for a visit in 4 days.   Contact information:   [address] [place] [address] [phone]       Follow up with [facility], MD. Schedule an appointment as soon as possible for a visit in 2 weeks.   Contact information:   [address] [place] [address] [phone]       Follow up with Gates Rigg, MD. Schedule an appointment as soon as possible for a visit in 2 months.  (Stroke Clinic)    Contact information:   [address], [address] [facility] [place] [address] [phone]           The results of significant diagnostics from this hospitalization (including imaging, microbiology, ancillary and laboratory) are listed below for reference.    Significant Diagnostic Studies: Dg Chest 2 View  01/25/2012  *RADIOLOGY REPORT*  Clinical Data: Stroke  CHEST - 2 VIEW  Comparison: None.  Findings: Ill-defined density is present at the right apex. Diffuse interstitial prominence and bronchitic changes.  Mild cardiomegaly.  No pneumothorax or pleural effusion.  Compression fractures of T9 and T12 of indeterminate age are present.  IMPRESSION: Ill-defined right upper lobe opacity.  Comparison with prior studies is recommended.   If none are available, CT can be performed to exclude mass.  T9 and T12 compression fractures of indeterminate age.  Original Report Authenticated By: Donavan Burnet, M.D.  Ct Head Wo Contrast  01/24/2012  *RADIOLOGY REPORT*  Clinical Data: Weakness, confusion  CT HEAD WITHOUT CONTRAST  Technique:  Contiguous axial images were obtained from the base of the skull through the vertex without contrast.  Comparison: None.  Findings: Focal hypoattenuation in the right basal ganglia may represent subacute infarct. Diffuse parenchymal atrophy. Patchy areas of hypoattenuation in deep and periventricular white matter bilaterally. Negative for acute intracranial hemorrhage, mass lesion, acute infarction, midline shift, or mass-effect. Acute infarct may be inapparent on noncontrast CT. Ventricles and sulci symmetric. Bone windows demonstrate no focal lesion.  IMPRESSION:  1.  Focal hypoattenuation in the right basal ganglia may represent subacute or chronic non hemorrhagic lacunar type infarct. I telephoned the critical test results to Dr. Manus Gunning at the time of interpretation.  2. Atrophy and nonspecific white matter changes.   Original Report Authenticated By: Osa Craver, M.D.   Ct Chest W Contrast  01/25/2012  *RADIOLOGY REPORT*  Clinical Data: Chest pain.  Short of breath.  Abnormal chest x-ray.  CT CHEST WITH CONTRAST  Technique:  Multidetector CT imaging of the chest was performed following the standard protocol during bolus administration of intravenous contrast.  Contrast: 80mL OMNIPAQUE IOHEXOL 300 MG/ML  SOLN  Comparison: 01/25/2012.  Findings: Cardiomegaly.  Aortic atherosclerosis.  No acute aortic abnormality.  No effusion.  Incidental imaging of the upper abdomen is within normal limits.  Right apical density represents an area of linear scarring.  No discrete mass lesion is identified.  There is no airspace disease. Interlobular septal thickening is present.  Scattered subpleural nodules are present diffusely.  Linear subsegmental atelectasis in the right lower lobe.  8 mm by 5 mm nodule is present in the anterior right middle lobe.  There is an enlarged carinal lymph node measuring 18 mm short axis. Small right hilar lymph node is also present.  These may be congestive or reactive. No aggressive osseous lesions are present. L1 compression fracture with retropulsion appears chronic.  T10 compression fracture is present with minimal retropulsion also appears chronic with endplate sclerosis.  IMPRESSION: 1.  Density in the right upper lobe on chest radiograph represents an area of subpleural scarring. 2.  Scattered subpleural nodules are present, most prominent in the anterior right middle lobe measuring 8 mm x 5 mm. If the patient is at high risk for bronchogenic carcinoma, follow-up chest CT at 6-12 months is recommended.  If the patient is at low risk for bronchogenic carcinoma, follow-up chest CT at 12 months is recommended.  This recommendation follows the consensus statement: Guidelines for Management of Small Pulmonary Nodules Detected on CT Scans: A Statement from the Fleischner Society as published in  Radiology 2005; 237:395-400. 3.  Precarinal lymph node measuring 18 mm short axis is nonspecific.  This may be congestive or reactive.  For interlobular septal thickening is present diffusely, which is nonspecific. Potentially this could relate to interstitial pulmonary edema in this patient with cardiomegaly.  Original Report Authenticated By: Andreas Newport, M.D.   Mr Brain Wo Contrast  01/25/2012  *RADIOLOGY REPORT*  Clinical Data:  78 year old female with acute onset of expressive aphasia and dizziness.  CT abnormality in the right basal ganglia.  Comparison: Head CT without contrast 01/24/2012.  MRI HEAD WITHOUT CONTRAST  Technique: Multiplanar, multiecho pulse sequences of the brain and surrounding structures were obtained according to standard protocol without intravenous contrast.  Findings: Confluent area of restricted diffusion in the posterior left temporal lobe and lower left parietal lobe abutting  the atrium of the left lateral ventricle (series 4 image 15).  Scattered additional mostly punctate areas of cortical restricted diffusion in the left superior frontal gyrus and left parietal lobe (image 22).  Additionally there is a punctate acute or subacute area of restricted diffusion in the right parietal lobe (image 20).  No other right hemisphere and no posterior fossa diffusion abnormality.  Mild T2 and FLAIR hyperintensity in the acutely affected areas.  No mass effect or hemorrhage. Major intracranial vascular flow voids are preserved, however, there is increased FLAIR signal and some posterior left MCA branches (series 8 image 14), see MRA findings below.  Patchy bilateral cerebral white matter T2 and FLAIR hyperintensity in the periventricular pattern.  Mild to moderate T2 heterogeneity in the deep gray matter nuclei and brain stem.  Occasional tiny chronic lacunar infarcts in the cerebellum.  Negative visualized cervical spine.  Normal bone marrow signal.  Postoperative changes to the globes.   Negative paranasal sinuses and mastoids.  Negative scalp soft tissues.  IMPRESSION: 1.  Acute scattered left MCA infarcts, largest area of involvement in the posterior MCA territory.  No mass effect or hemorrhage. 2.  Superimposed punctate acute or subacute posterior right MCA lacunar infarct.  Legrand Rams this represents a synchronous ischemia (rather than an embolic phenomena). 3.  Underlying chronic small vessel disease. 4.  See MRA findings below.  MRA HEAD WITHOUT CONTRAST  Technique: Angiographic images of the Circle of Willis were obtained using MRA technique without  intravenous contrast.  Findings: Mildly degraded by motion despite repeated imaging attempts.  Antegrade flow in the posterior circulation codominant distal vertebral arteries.  Normal left PICA.  Patent vertebrobasilar junction with mild dolichoectasia.  Patent bilateral AICA vessels. No basilar stenosis.  SCA and PCA origins are within normal limits. Posterior communicating arteries are diminutive or absent. Bilateral PCA branches are within normal limits allowing for some motion.  Antegrade flow in both ICA siphons.  Irregularity in both cavernous segment probably compounded by susceptibility artifact from the aerated sphenoid sinuses.  No hemodynamically significant ICA stenosis suspected.  Ophthalmic artery origins are within normal limits.  Patent carotid termini.  Dominant left ACA A1 segment.  Nondominant right A1 segment with irregularity and tandem stenoses.  Anterior communicating artery is patent.  Decreased right ACA A2 flow, or persistent nondominant anatomy.  Left MCA M1 segment is mildly irregular but patent.  No stenosis is identified at the bifurcation or involving the posterior sylvian division.  No major left MCA branch occlusion is identified.  Visualized right MCA branches likewise are within normal limits.  IMPRESSION: 1.  Anterior circulation atherosclerosis, but no definite stenosis or major branch occlusion correspond to the  acute left MCA infarcts. 2.  Right ACA irregularity and decreased flow probably corresponds to a combination of atherosclerosis and congenital anatomic variation. 3.  Essentially negative posterior circulation.  Original Report Authenticated By: Harley Hallmark, M.D.   Mr Maxine Glenn Head/brain Wo Cm  01/25/2012  *RADIOLOGY REPORT*  Clinical Data:  78 year old female with acute onset of expressive aphasia and dizziness.  CT abnormality in the right basal ganglia.  Comparison: Head CT without contrast 01/24/2012.  MRI HEAD WITHOUT CONTRAST  Technique: Multiplanar, multiecho pulse sequences of the brain and surrounding structures were obtained according to standard protocol without intravenous contrast.  Findings: Confluent area of restricted diffusion in the posterior left temporal lobe and lower left parietal lobe abutting the atrium of the left lateral ventricle (series 4 image 15).  Scattered additional mostly  punctate areas of cortical restricted diffusion in the left superior frontal gyrus and left parietal lobe (image 22).  Additionally there is a punctate acute or subacute area of restricted diffusion in the right parietal lobe (image 20).  No other right hemisphere and no posterior fossa diffusion abnormality.  Mild T2 and FLAIR hyperintensity in the acutely affected areas.  No mass effect or hemorrhage. Major intracranial vascular flow voids are preserved, however, there is increased FLAIR signal and some posterior left MCA branches (series 8 image 14), see MRA findings below.  Patchy bilateral cerebral white matter T2 and FLAIR hyperintensity in the periventricular pattern.  Mild to moderate T2 heterogeneity in the deep gray matter nuclei and brain stem.  Occasional tiny chronic lacunar infarcts in the cerebellum.  Negative visualized cervical spine.  Normal bone marrow signal.  Postoperative changes to the globes.  Negative paranasal sinuses and mastoids.  Negative scalp soft tissues.  IMPRESSION: 1.  Acute  scattered left MCA infarcts, largest area of involvement in the posterior MCA territory.  No mass effect or hemorrhage. 2.  Superimposed punctate acute or subacute posterior right MCA lacunar infarct.  Legrand Rams this represents a synchronous ischemia (rather than an embolic phenomena). 3.  Underlying chronic small vessel disease. 4.  See MRA findings below.  MRA HEAD WITHOUT CONTRAST  Technique: Angiographic images of the Circle of Willis were obtained using MRA technique without  intravenous contrast.  Findings: Mildly degraded by motion despite repeated imaging attempts.  Antegrade flow in the posterior circulation codominant distal vertebral arteries.  Normal left PICA.  Patent vertebrobasilar junction with mild dolichoectasia.  Patent bilateral AICA vessels. No basilar stenosis.  SCA and PCA origins are within normal limits. Posterior communicating arteries are diminutive or absent. Bilateral PCA branches are within normal limits allowing for some motion.  Antegrade flow in both ICA siphons.  Irregularity in both cavernous segment probably compounded by susceptibility artifact from the aerated sphenoid sinuses.  No hemodynamically significant ICA stenosis suspected.  Ophthalmic artery origins are within normal limits.  Patent carotid termini.  Dominant left ACA A1 segment.  Nondominant right A1 segment with irregularity and tandem stenoses.  Anterior communicating artery is patent.  Decreased right ACA A2 flow, or persistent nondominant anatomy.  Left MCA M1 segment is mildly irregular but patent.  No stenosis is identified at the bifurcation or involving the posterior sylvian division.  No major left MCA branch occlusion is identified.  Visualized right MCA branches likewise are within normal limits.  IMPRESSION: 1.  Anterior circulation atherosclerosis, but no definite stenosis or major branch occlusion correspond to the acute left MCA infarcts. 2.  Right ACA irregularity and decreased flow probably corresponds to  a combination of atherosclerosis and congenital anatomic variation. 3.  Essentially negative posterior circulation.  Original Report Authenticated By: Harley Hallmark, M.D.    Microbiology: No results found for this or any previous visit (from the past 240 hour(s)).   Labs: Basic Metabolic Panel:  Lab 01/26/12 9604 01/25/12 0450 01/24/12 2023 01/24/12 2004  NA 142 137 142 139  K 4.5 3.0* 3.2* 3.1*  CL 106 98 101 99  CO2 25 29 -- 29  GLUCOSE 95 96 107* 109*  BUN 15 15 19 18   CREATININE 0.84 0.84 1.20* 1.00  CALCIUM 9.5 9.0 -- 9.1  MG 2.7* -- -- --  PHOS -- -- -- --   Liver Function Tests:  Lab 01/25/12 0450 01/24/12 2004  AST 25 28  ALT 9 12  ALKPHOS 55  61  BILITOT 0.6 0.4  PROT 7.1 7.7  ALBUMIN 3.3* 3.6   No results found for this basename: LIPASE:5,AMYLASE:5 in the last 168 hours No results found for this basename: AMMONIA:5 in the last 168 hours CBC:  Lab 01/26/12 0620 01/25/12 0253 01/24/12 2023 01/24/12 2004  WBC 10.3 9.4 -- 12.1*  NEUTROABS -- 4.7 -- 4.0  HGB 14.0 12.8 15.0 13.7  HCT 42.1 39.3 44.0 42.3  MCV 85.4 84.5 -- 84.6  PLT 376 360 -- 398   Cardiac Enzymes:  Lab 01/24/12 2005  CKTOTAL 105  CKMB 2.1  CKMBINDEX --  TROPONINI <0.30   BNP: BNP (last 3 results) No results found for this basename: PROBNP:3 in the last 8760 hours CBG:  Lab 01/24/12 2023  GLUCAP 107*    Time coordinating discharge: 40  minutes  Signed:  Jaeger Trueheart,CHRISTOPHER  [facility] 01/27/2012, 2:45 PM

## 2012-01-27 NOTE — Clinical Social Work Psychosocial (Signed)
     Clinical Social Work Department BRIEF PSYCHOSOCIAL ASSESSMENT 01/27/2012  Patient:  Casey White,Casey White     Account Number:  [id number]     Admit date:  01/24/2012  Clinical Social Worker:  Peggyann Shoals  Date/Time:  01/27/2012 11:17 AM  Referred by:  CSW  Date Referred:  01/27/2012 Referred for  SNF Placement   Other Referral:   Interview type:  Family Other interview type:    PSYCHOSOCIAL DATA Living Status:  HUSBAND Admitted from facility:   Level of care:   Primary support name:  Casey White Primary support relationship to patient:  CHILD, ADULT Degree of support available:   Supportive    CURRENT CONCERNS Current Concerns  Post-Acute Placement   Other Concerns:    SOCIAL WORK ASSESSMENT / PLAN CSW met with pt and family to address consult. CSW introduced herself and explained role of social work. Pt lives with husband, however he is currently in a SNF. Pt's family lives near by and is able to assist. Pt and family are interested in SNF placement as [facility] is unable to admit pt.    CSW explained process of SNF placement. CSW will initiat SNF referral for [facility]. CSW will follow up with bed offers. CSW will continue to follow.   Assessment/plan status:  Psychosocial Support/Ongoing Assessment of Needs Other assessment/ plan:   Information/referral to community resources:   SNF List    PATIENTS/FAMILYS RESPONSE TO PLAN OF CARE: Pt was alert and oriented. Pt and family are agreeable to discharge plan.

## 2012-01-27 NOTE — Clinical Social Work Placement (Addendum)
    Clinical Social Work Department CLINICAL SOCIAL WORK PLACEMENT NOTE 01/27/2012  Patient:  Casey White,Casey White  Account Number:  [id number] Admit date:  01/24/2012  Clinical Social Worker:  Peggyann Shoals  Date/time:  01/27/2012 11:32 AM  Clinical Social Work is seeking post-discharge placement for this patient at the following level of care:   SKILLED NURSING   (*CSW will update this form in Epic as items are completed)   01/27/2012  Patient/family provided with [facility] Department of Clinical Social Work's list of facilities offering this level of care within the geographic area requested by the patient (or if unable, by the patient's family).  01/27/2012  Patient/family informed of their freedom to choose among providers that offer the needed level of care, that participate in Medicare, Medicaid or managed care program needed by the patient, have an available bed and are willing to accept the patient.  01/27/2012  Patient/family informed of [facility]' ownership interest in [facility], as well as of the fact that they are under no obligation to receive care at this facility.  PASARR submitted to EDS on 01/27/2012 PASARR number received from EDS on 01/27/2012  FL2 transmitted to all facilities in geographic area requested by pt/family on  01/27/2012 FL2 transmitted to all facilities within larger geographic area on   Patient informed that his/her managed care company has contracts with or will negotiate with  certain facilities, including the following:     Patient/family informed of bed offers received:  01/27/2012 Patient chooses bed at [facility]  Physician recommends and patient chooses bed at [facility]  Patient to be transferred to [facility] on 01/27/2012 Patient to be transferred to facility by [facility]  The following physician request were entered in Epic:   Additional Comments:

## 2012-01-27 NOTE — Clinical Social Work Note (Signed)
Pt is ready for discharge to [facility]. Facility has received discharge summary and is ready to admit pt. Pt and family are agreeable to discharge plan. [facility] will provide transportation. CSW is signing off as no further needs identified.   Dede Query, MSW, Theresia Majors [phone]

## 2012-01-27 NOTE — Progress Notes (Signed)
Pt had 4 beat run of nonsustained Vtach. Pt asymptomatic. VS stable. NP Schorr aware. Will continue to monitor patient.

## 2012-01-27 NOTE — Progress Notes (Signed)
ANTICOAGULATION CONSULT NOTE - Initial Consult  Pharmacy Consult for Coumadin Indication: h/o Atrial fibrillation/stroke  Allergies  Allergen Reactions  . Amoxicillin Other (See Comments)    unknown  . Codeine Nausea And Vomiting  . Demerol (Meperidine) Nausea And Vomiting    Patient Measurements: Height: 5\' 6"  (167.6 cm) Weight: 135 lb 2.3 oz (61.3 kg) IBW/kg (Calculated) : 59.3   Vital Signs: Temp: 97.6 F (36.4 C) (08/14 1040) Temp src: Oral (08/14 1040) BP: 113/87 mmHg (08/14 1040) Pulse Rate: 64  (08/14 1040)  Labs:  Alvira Philips 01/27/12 0717 01/26/12 0620 01/25/12 0450 01/25/12 0253 01/24/12 2023 01/24/12 2005 01/24/12 2004  HGB -- 14.0 -- 12.8 -- -- --  HCT -- 42.1 -- 39.3 44.0 -- --  PLT -- 376 -- 360 -- -- 398  APTT -- -- -- -- -- -- 39*  LABPROT 31.3* 26.0* 22.6* -- -- -- --  INR 2.96* 2.34* 1.95* -- -- -- --  HEPARINUNFRC -- -- -- -- -- -- --  CREATININE -- 0.84 0.84 -- 1.20* -- --  CKTOTAL -- -- -- -- -- 105 --  CKMB -- -- -- -- -- 2.1 --  TROPONINI -- -- -- -- -- <0.30 --    Estimated Creatinine Clearance: 48.3 ml/min (by C-G formula based on Cr of 0.84).   Medical History: Past Medical History  Diagnosis Date  . TIA (transient ischemic attack)   . Dysrhythmia     Medications:  Scheduled:     . atorvastatin  20 mg Oral q1800  . warfarin  2.5 mg Oral Q T,Th,S,Su-1800  . warfarin  5 mg Oral Q M,W,F-1800  . Warfarin - Pharmacist Dosing Inpatient   Does not apply q1800    Assessment: 78 yo female with h/o atrial fibrillation and stroke admitted with TIA vs CVA. INR therapeutic and trend up. Patient takes 5mg  daily except 2.5mg  on T/TH/Sat/Sun. INR large increase today, will try to stabilize INR not using home regimen. Patient failed coumadin since was admitted therapeutic with stroke.  Goal of Therapy:  INR 2-3 Monitor platelets by anticoagulation protocol: Yes   Plan:  Coumadin 2.5mg  today and f/u daily protime.   Verlene Mayer,  PharmD, BCPS Pager [phone] 01/27/2012,12:57 PM

## 2012-01-27 NOTE — Progress Notes (Signed)
Occupational Therapy Treatment Patient Details Name: Casey White MRN: [id number] DOB: 08-25-1927 Today's Date: 01/27/2012 Time: 4098-1191 OT Time Calculation (min): 24 min  OT Assessment / Plan / Recommendation Comments on Treatment Session Pt progressing with therapy. Continue to recommend SNF for d/c plan    Follow Up Recommendations  Skilled nursing facility    Barriers to Discharge       Equipment Recommendations  Defer to next venue    Recommendations for Other Services    Frequency     Plan Discharge plan needs to be updated    Precautions / Restrictions Precautions Precautions: Fall Precaution Comments: Rt visual field cut Restrictions Weight Bearing Restrictions: No   Pertinent Vitals/Pain Pt with no c/o pain    ADL  Lower Body Dressing: Performed;Min guard Where Assessed - Lower Body Dressing: Unsupported sitting Toilet Transfer: Simulated;Min guard Toilet Transfer Method: Sit to Barista:  (from bed) Transfers/Ambulation Related to ADLs: Min guard A with ambulation throughout room ADL Comments: Session focused on short term memory and scanning environment. Pt given 3 items to remember and find. Pt able to repeat items x 2 before beginning search. Pt unable to locate any of the items and attach their meaning back to what she was looking for/purpose of activity. When asked, "Is this toothpaste?" Pt able to respond appropriately, yes or no. If yes, pt unable to connect this back to task of finding that object. Also, worked on Adult nurse. Pt able to state she would call 9-1-1 if someone broke into house, but unable to locate buttons on phone even with max A and repeated hand over hand assist. Pt was able to locate 3 button consistently.    OT Diagnosis:    OT Problem List:   OT Treatment Interventions:     OT Goals ADL Goals ADL Goal: Additional Goal #1 - Progress: Progressing toward goals  Visit Information  Last OT Received On:  01/27/12 Assistance Needed: +1    Subjective Data      Prior Functioning       Cognition  Overall Cognitive Status: Impaired Area of Impairment: Memory;Attention;Following commands;Awareness of deficits;Awareness of errors;Problem solving Arousal/Alertness: Awake/alert Orientation Level: Time;Situation Behavior During Session: [facility] for tasks performed Current Attention Level: Sustained Attention - Other Comments: ~20sec Memory Deficits: pt unable to remember task at hand (finding objects) Following Commands: Follows one step commands inconsistently Safety/Judgement: Decreased awareness of need for assistance;Impulsive Awareness of Errors: Assistance required to identify errors made;Assistance required to correct errors made Cognition - Other Comments: see ADL comments section for details    Mobility Bed Mobility Left Sidelying to Sit: 6: Modified independent (Device/Increase time);With rails Sit to Supine: 6: Modified independent (Device/Increase time);With rail Transfers Sit to Stand: 4: Min guard;From bed Stand to Sit: 4: Min guard;To bed Details for Transfer Assistance: for safety   Exercises    Balance    End of Session OT - End of Session Equipment Utilized During Treatment: Gait belt Activity Tolerance: Patient tolerated treatment well Patient left: in bed;with call bell/phone within reach;with bed alarm set;with family/visitor present  GO     Casey White 01/27/2012, 3:57 PM

## 2012-01-27 NOTE — Progress Notes (Signed)
It is recommended that patient d/c home with 24/7 assist of family or SNF at this time. MIn guard assist not at a level that [employer] would approve for extensive inpt rehab at this time. Please call with any questions. Discussed with RN CM. [phone]

## 2012-01-28 ENCOUNTER — Encounter: Payer: Self-pay | Admitting: Cardiology

## 2012-03-04 ENCOUNTER — Encounter: Payer: Self-pay | Admitting: Internal Medicine

## 2012-03-04 ENCOUNTER — Ambulatory Visit (INDEPENDENT_AMBULATORY_CARE_PROVIDER_SITE_OTHER): Payer: Medicare Other | Admitting: Internal Medicine

## 2012-03-04 VITALS — BP 92/60 | HR 77 | Temp 97.6°F | Ht 67.0 in | Wt 130.0 lb

## 2012-03-04 DIAGNOSIS — J019 Acute sinusitis, unspecified: Secondary | ICD-10-CM | POA: Insufficient documentation

## 2012-03-04 DIAGNOSIS — R634 Abnormal weight loss: Secondary | ICD-10-CM

## 2012-03-04 DIAGNOSIS — R911 Solitary pulmonary nodule: Secondary | ICD-10-CM

## 2012-03-04 MED ORDER — DOXYCYCLINE HYCLATE 100 MG PO TABS: 100.0000 mg | ORAL_TABLET | Freq: Two times a day (BID) | ORAL | Status: DC

## 2012-03-04 NOTE — Assessment & Plan Note (Signed)
#  Lung nodule  - have CT chest in 3 months at [facility] in [place], [place] and then see Dr Sherene Sires in [place], [place] (they want closer [place]oll[place]up to [place], [place] where they live. Daughter finding logistics of coming to [place] difficult)    #followup  3 months with CT and Dr Sherene Sires in [place], [place]

## 2012-03-04 NOTE — Assessment & Plan Note (Signed)
#  Weight loss  - likely due to systolic heart failure and general failure to thrive  - the nodule in lung is not cause of weight loss  - please talk more about weight loss with Dr Sherril Croon  - rehab should hopefully help with weight loss and fatigue

## 2012-03-04 NOTE — Patient Instructions (Addendum)
#  Acute sinus infection  Take doxycycline 100mg  po twice daily x 7 days; take after meals and avoid sunlight. If nauseated or vomitting, let us know. Make sure you eat after meals  #Lung nodule  - have CT chest in 3 months at [facility] in [place], [place] and then see Dr Sherene Sires in [place], [place]  #Weight loss  - likely due to systolic heart failure and general failure to thrive  - the nodule in lung is not cause of weight loss  - please talk more about weight loss with Dr Sherril Croon  - rehab should hopefully help with weight loss and fatigue  #followup  3 months with CT and Dr Sherene Sires in [place], [place]

## 2012-03-04 NOTE — Assessment & Plan Note (Signed)
#  Acute sinus infection  Take doxycycline 100mg  po twice daily x 7 days; take after meals and avoid sunlight. If nauseated or vomitting, let us know. Make sure you eat after meals

## 2012-03-04 NOTE — Progress Notes (Signed)
Subjective:    Patient ID: Casey White, female    DOB: November 14, 1927, 78 y.o.   MRN: [id number]  HPI  PCP is VYAS,DHRUV B., MD  78 year old female. Body mass index is 20.36 kg/(m^2).  reports that she quit smoking about 46 years ago. Her smoking use included Cigarettes. She has a 18 pack-year smoking history. She does not have any smokeless tobacco history on file.  IOV 03/04/2012   - 78 year old female. Accompanied by Dr Belva Bertin her daughter who gives hx.   She was admitted 01/24/12 to 01/27/12 with known hx of A Fib on coumadin and prior TIA but with acute dysphasia.  MRI brain showed acute scattered left MCA infarcts, largest area of involvement in the posterior MCA territory. Superimposed punctate acute or subacute posterior right MCA lacunar infarct. Brain MRA showed anterior circulation atherosclerosis, but no definite stenosis or major branch occlusion correspond to the acute left MCA infarcts. Right ACA irregularity and decreased flow probably corresponds to a combination of atherosclerosis and congenital anatomic variation. 3. Essentially negative posterior circulation. 2D Echocardiogram showed EF 40-45% with no source of embolus. There is akinesis of the distal inferoseptal anteroseptal and apical myocardium and Carotid Doppler showed no internal carotid artery stenosis bilaterally. Vertebrals with antegrade flow bilaterally. Patient was continued on Coumadin and Lipitor. Physical therapist  recommended SNF placement.   Thre are 3 main issues for this visit  1)  Pulmonary nodules: Patient's CXR on 01/25/12, revealed Ill-defined right upper lobe opacity .  Follow up CT scan confirmed scattered subpleural nodules, most prominent in the anterior right middle lobe measuring 8 mm x 5 mm (see results below). So, has been refrred here  2) Also, acute 3 days of green runny nose wit cough and nasal congestion. No fver. No wheeze. Mild- moderate in intensity  3) Their main concern is weight loss  x 5 years. Insidious onset. Progressive. Very concerning. Used to weight 160# 5 years ago but now 130#. This is associated with generalized weakness and poor ECOG of 3-4 (sitting in chair most of the time). She has been  Admitted 2 times for medical issues in this time. Memory is good per daughter. NO associated dysphagia. There is associated systolic CHF diagnosis. Per daughter colonoscopy years ago was negative; currently deferred due to medical issues. Mammogram per hx is normal but not done in "recent times". Thyroid normal per hx. On Vitamin D replacement per hx. Daughter frightened and very concerned about decline esp 65 year old husband also in same SNF. They are looking for answers for pulmonary   CT chest 01/25/12  IMPRESSION:  1. Density in the right upper lobe on chest radiograph represents  an area of subpleural scarring.  2. Scattered subpleural nodules are present, most prominent in the  anterior right middle lobe measuring 8 mm x 5 mm. If the patient is  at high risk for bronchogenic carcinoma, follow-up chest CT at 6-12  months is recommended. If the patient is at low risk for  bronchogenic carcinoma, follow-up chest CT at 12 months is  recommended. This recommendation follows the consensus statement:  Guidelines for Management of Small Pulmonary Nodules Detected on CT  Scans: A Statement from the Fleischner Society as published in  Radiology 2005; 237:395-400.  3. Precarinal lymph node measuring 18 mm short axis is  nonspecific. This may be congestive or reactive. For interlobular  septal thickening is present diffusely, which is nonspecific.  Potentially this could relate to interstitial  pulmonary edema in  this patient with cardiomegaly.  Original Report Authenticated By: Andreas Newport, M.D.    Past Medical History  Diagnosis Date  . CHF (congestive heart failure)     WITH MODERATE SYSTOLIC DYSFUNCTION. EF 35-45%  . Chronic atrial fibrillation   . Chronic  anticoagulation   . GERD (gastroesophageal reflux disease)   . Depression   . PVCs (premature ventricular contractions)   . TIA (transient ischemic attack)   . Dysrhythmia      Family History  Problem Relation Age of Onset  . Colon cancer Father   . Heart attack Brother      History   Social History  . Marital Status: Married    Spouse Name: N/A    Number of Children: 2  . Years of Education: N/A   Occupational History  . retired    Social History Main Topics  . Smoking status: Former Smoker -- 1.0 packs/day for 18 years    Types: Cigarettes    Quit date: 11/20/1965  . Smokeless tobacco: Not on file  . Alcohol Use: No  . Drug Use: No  . Sexually Active: Not on file   Other Topics Concern  . Not on file   Social History Narrative   ** Merged History Encounter **      Allergies  Allergen Reactions  . Amoxicillin Other (See Comments)    unknown  . Angiotensin Receptor Blockers Other (See Comments)    hypotension  . Ciprofloxacin   . Codeine   . Codeine Nausea And Vomiting  . Demerol (Meperidine) Nausea And Vomiting  . Lisinopril Cough  . Meperidine Hcl   . Morphine   . Nitrofurantoin   . Sulfonamide Derivatives      Outpatient Prescriptions Prior to Visit  Medication Sig Dispense Refill  . ALPRAZolam (XANAX) 0.25 MG tablet Take 0.25 mg by mouth at bedtime as needed.        Marland Kitchen atorvastatin (LIPITOR) 20 MG tablet Take 1 tablet (20 mg total) by mouth daily at 6 PM.  30 tablet  0  . furosemide (LASIX) 20 MG tablet Take 60 mg by mouth 2 (two) times daily.        . metoprolol (TOPROL-XL) 50 MG 24 hr tablet Take 1 tablet (50 mg total) by mouth daily.  90 tablet  3  . metoprolol succinate (TOPROL-XL) 50 MG 24 hr tablet Take 50 mg by mouth daily. Take with or immediately following a meal.      . senna-docusate (SENOKOT-S) 8.6-50 MG per tablet Take 1 tablet by mouth at bedtime as needed.  30 tablet  0  . traMADol-acetaminophen (ULTRACET) 37.5-325 MG per tablet Take  1 tablet by mouth every 6 (six) hours as needed.        . warfarin (COUMADIN) 5 MG tablet Take 2.5-5 mg by mouth. Takes 5 mg on Monday, Wednesday , & Fridays and 2.5 mg all the other days of the week.      . calcium carbonate (OS-CAL - DOSED IN MG OF ELEMENTAL CALCIUM) 1250 MG tablet Take 1 tablet by mouth daily.        Marland Kitchen CALCIUM PO Take 1 tablet by mouth daily.      . mirtazapine (REMERON) 15 MG tablet Take 7.5 mg by mouth at bedtime.       . traMADol-acetaminophen (ULTRACET) 37.5-325 MG per tablet Take 1-2 tablets by mouth every 6 (six) hours as needed. For pain      . warfarin (COUMADIN)  5 MG tablet Take 5 mg by mouth as directed.             Review of Systems  Constitutional: Positive for fatigue and unexpected weight change. Negative for fever.  HENT: Positive for congestion, rhinorrhea and postnasal drip. Negative for ear pain, nosebleeds, sore throat, sneezing, trouble swallowing, dental problem and sinus pressure.   Eyes: Negative for redness and itching.  Respiratory: Positive for shortness of breath. Negative for cough, chest tightness and wheezing.   Cardiovascular: Positive for palpitations. Negative for leg swelling.  Gastrointestinal: Negative for nausea and vomiting.  Genitourinary: Negative for dysuria.  Musculoskeletal: Negative for joint swelling.  Skin: Negative for rash.  Neurological: Negative for headaches.  Hematological: Does not bruise/bleed easily.  Psychiatric/Behavioral: Negative for dysphoric mood. The patient is not nervous/anxious.        Objective:   Physical Exam  Vitals reviewed. Constitutional: She is oriented to person, place, and time. She appears well-developed and well-nourished. No distress.       Body mass index is 20.36 kg/(m^2). Frail female - looks stage age  HENT:  Head: Normocephalic and atraumatic.  Right Ear: External ear normal.  Left Ear: External ear normal.  Mouth/Throat: Oropharynx is clear and moist. No oropharyngeal exudate.   Eyes: Conjunctivae normal and EOM are normal. Pupils are equal, round, and reactive to light. Right eye exhibits no discharge. Left eye exhibits no discharge. No scleral icterus.  Neck: Normal range of motion. Neck supple. No JVD present. No tracheal deviation present. No thyromegaly present.  Cardiovascular: Normal rate, regular rhythm, normal heart sounds and intact distal pulses.  Exam reveals no gallop and no friction rub.   No murmur heard. Pulmonary/Chest: Effort normal and breath sounds normal. No respiratory distress. She has no wheezes. She has no rales. She exhibits no tenderness.  Abdominal: Soft. Bowel sounds are normal. She exhibits no distension and no mass. There is no tenderness. There is no rebound and no guarding.  Musculoskeletal: Normal range of motion. She exhibits no edema and no tenderness.       OA with heberden nodes +  Lymphadenopathy:    She has no cervical adenopathy.  Neurological: She is alert and oriented to person, place, and time. She has normal reflexes. No cranial nerve deficit. She exhibits normal muscle tone. Coordination normal.       Flat affect. Normal speech. Withdrawn  Skin: Skin is warm and dry. No rash noted. She is not diaphoretic. No erythema. No pallor.  Psychiatric: She has a normal mood and affect. Her behavior is normal. Judgment and thought content normal.          Assessment & Plan:

## 2012-03-17 DIAGNOSIS — I509 Heart failure, unspecified: Secondary | ICD-10-CM

## 2012-03-17 DIAGNOSIS — R5381 Other malaise: Secondary | ICD-10-CM

## 2012-03-17 DIAGNOSIS — I4891 Unspecified atrial fibrillation: Secondary | ICD-10-CM

## 2012-03-17 DIAGNOSIS — R5383 Other fatigue: Secondary | ICD-10-CM

## 2012-03-18 ENCOUNTER — Encounter: Payer: Self-pay | Admitting: Cardiology

## 2012-03-18 NOTE — Progress Notes (Signed)
   The patient is currently hospitalized at [facility]. It seems that she was dehydrated with admission. Records show that she had been on a diuretic before admission. It appears that she will be discharged home today by Dr. Sherril Croon. She may go home off diuretics because of her presentation with dehydration. It will be appropriate for her to be seen for early followup to be sure that her medications are stable going forward as an outpatient. Historically she has been followed by Dr.Jordan in [place] office. She tells me that she has transferred her care here to [facility]. I will arrange for her early post hospital visit to be in the [facility] office with our doctor who did the current hospital consult or with Mr. Shara Blazing.  Jerral Bonito, MD

## 2012-03-25 ENCOUNTER — Encounter: Payer: Self-pay | Admitting: Physician Assistant

## 2012-03-29 ENCOUNTER — Encounter: Payer: Self-pay | Admitting: Physician Assistant

## 2012-03-29 ENCOUNTER — Ambulatory Visit (INDEPENDENT_AMBULATORY_CARE_PROVIDER_SITE_OTHER): Payer: Medicare Other | Admitting: Physician Assistant

## 2012-03-29 VITALS — BP 106/68 | HR 60 | Ht 65.0 in | Wt 118.0 lb

## 2012-03-29 DIAGNOSIS — I6340 Cerebral infarction due to embolism of unspecified cerebral artery: Secondary | ICD-10-CM

## 2012-03-29 DIAGNOSIS — I429 Cardiomyopathy, unspecified: Secondary | ICD-10-CM

## 2012-03-29 DIAGNOSIS — I482 Chronic atrial fibrillation, unspecified: Secondary | ICD-10-CM

## 2012-03-29 DIAGNOSIS — I4891 Unspecified atrial fibrillation: Secondary | ICD-10-CM

## 2012-03-29 DIAGNOSIS — I5022 Chronic systolic (congestive) heart failure: Secondary | ICD-10-CM

## 2012-03-29 DIAGNOSIS — I428 Other cardiomyopathies: Secondary | ICD-10-CM

## 2012-03-29 DIAGNOSIS — I493 Ventricular premature depolarization: Secondary | ICD-10-CM | POA: Insufficient documentation

## 2012-03-29 DIAGNOSIS — I4949 Other premature depolarization: Secondary | ICD-10-CM

## 2012-03-29 NOTE — Assessment & Plan Note (Signed)
On beta blocker therapy

## 2012-03-29 NOTE — Progress Notes (Signed)
Primary Cardiologist: Simona Huh, MD (new)   HPI: Post hospital followup from [facility], seen in consultation, by Dr. Diona Browner, for valuation of weakness and probable multifactorial dyspnea. She was noted to have had a recent stroke, this past August, and was treated at [facility].  She presented with prior history of chronic SHF (EF 35-45%) and permanent AF, on Coumadin anticoagulation, and was previously followed by Dr. Peter [place], in [place].  Dr. Diona Browner felt that she did not appear to be in decompensated heart failure. She was noted to be in permanent AF, and was therapeutic on Coumadin.   He noted evidence of intravascular depletion and recommended discontinuing Lasix, and treating with gentle hydration. A followup echocardiogram was done:   - 2-D echo: EF 25-30%  Patient was subsequently followed by Dr. Myrtis Ser, who recommended reassessment of LVF at a later date.  Clinically, patient presents with no symptoms suggestive of CHF. She notes occasional palpitations. She was recently seen in post hospital followup by Dr. Sherril Croon, who is also managing her Coumadin, with no other medication adjustment noted.   12-lead EKG today indicates atrial fibrillation at 96 bpm; LAD; frequent PVCs  Allergies  Allergen Reactions  . Amoxicillin Other (See Comments)    unknown  . Angiotensin Receptor Blockers Other (See Comments)    hypotension  . Ciprofloxacin   . Codeine   . Codeine Nausea And Vomiting  . Demerol (Meperidine) Nausea And Vomiting  . Lisinopril Cough  . Meperidine Hcl   . Morphine   . Nitrofurantoin   . Sulfonamide Derivatives     Current Outpatient Prescriptions  Medication Sig Dispense Refill  . ALPRAZolam (XANAX) 0.25 MG tablet Take 0.25 mg by mouth at bedtime as needed.        Marland Kitchen atorvastatin (LIPITOR) 20 MG tablet Take 1 tablet (20 mg total) by mouth daily at 6 PM.  30 tablet  0  . furosemide (LASIX) 20 MG tablet Take 20 mg by mouth 2 (two) times daily.       . metoprolol  succinate (TOPROL-XL) 50 MG 24 hr tablet Take 50 mg by mouth daily. Take with or immediately following a meal.      . traMADol-acetaminophen (ULTRACET) 37.5-325 MG per tablet Take 1 tablet by mouth every 6 (six) hours as needed.        . warfarin (COUMADIN) 5 MG tablet Take 2.5-5 mg by mouth. Takes 5 mg on Monday, Wednesday , & Fridays and 2.5 mg all the other days of the week.      Marland Kitchen DISCONTD: metoprolol (TOPROL-XL) 50 MG 24 hr tablet Take 1 tablet (50 mg total) by mouth daily.  90 tablet  3    Past Medical History  Diagnosis Date  . CHF (congestive heart failure)     WITH MODERATE SYSTOLIC DYSFUNCTION. EF 35-45%  . Chronic atrial fibrillation   . Chronic anticoagulation   . GERD (gastroesophageal reflux disease)   . Depression   . PVCs (premature ventricular contractions)   . TIA (transient ischemic attack)   . Dysrhythmia     Past Surgical History  Procedure Date  . Knee surgery     LEFT KNEE  . Intraocular lens implant, secondary     bilateral  . Abdominal hysterectomy   . Appendectomy     History   Social History  . Marital Status: Married    Spouse Name: N/A    Number of Children: 2  . Years of Education: N/A   Occupational History  . retired  Social History Main Topics  . Smoking status: Former Smoker -- 1.0 packs/day for 18 years    Types: Cigarettes    Quit date: 11/20/1965  . Smokeless tobacco: Never Used  . Alcohol Use: No  . Drug Use: No  . Sexually Active: Not on file   Other Topics Concern  . Not on file   Social History Narrative   ** Merged History Encounter **     Family History  Problem Relation Age of Onset  . Colon cancer Father   . Heart attack Brother     ROS: no nausea, vomiting; no fever, chills; no melena, hematochezia; no claudication  PHYSICAL EXAM: BP 106/68  Pulse 60  Ht 5\' 5"  (1.651 m)  Wt 118 lb (53.524 kg)  BMI 19.64 kg/m2 GENERAL: 78 year old female, frail appearing ; NAD HEENT: NCAT, PERRLA, EOMI; sclera clear;  no xanthelasma NECK: palpable bilateral carotid pulses, no bruits; no JVD; no TM LUNGS: CTA bilaterally CARDIAC: irregular irregular (S1, S2); no significant murmurs; no rubs or gallops ABDOMEN: soft, non-tender; intact BS EXTREMETIES: intact distal pulses; no significant peripheral edema SKIN: warm/dry; no obvious rash/lesions MUSCULOSKELETAL: no joint deformity NEURO: no focal deficit; NL affect   EKG: reviewed and available in Electronic Records   ASSESSMENT & PLAN:  Chronic a-fib Patient remains in permanent atrial fibrillation, currently rate controlled on beta blocker therapy. On chronic Coumadin anticoagulation, followed by Dr. Sherril Croon. Of note, I would prefer substituting Toprol with carvedilol, given her underlying cardiomyopathy. However, her persistent hypotension precludes this, at present time.  Cardiomyopathy Recent echocardiogram suggested deterioration in LVF to 25-30%; this was previously assessed as 35-45%. Patient has no known history of CAD. This may be tachycardia mediated, secondary to uncontrolled AF. Recommended reassessment of LVF in 3 months. Continue current medical therapy with beta blocker, but unable to add ACE inhibitor secondary to persistent hypotension.  Embolic cerebral infarction Status post recent left MCA stroke, with associated expressive aphasia. Followed by Dr. Pearlean Brownie in [place].  Ventricular ectopy On beta blocker therapy    Gene Sadey Yandell, PAC

## 2012-03-29 NOTE — Assessment & Plan Note (Signed)
Status post recent left MCA stroke, with associated expressive aphasia. Followed by Dr. Pearlean Brownie in [place].

## 2012-03-29 NOTE — Assessment & Plan Note (Signed)
Patient remains in permanent atrial fibrillation, currently rate controlled on beta blocker therapy. On chronic Coumadin anticoagulation, followed by Dr. Sherril Croon. Of note, I would prefer substituting Toprol with carvedilol, given her underlying cardiomyopathy. However, her persistent hypotension precludes this, at present time.

## 2012-03-29 NOTE — Patient Instructions (Signed)
Continue all current medications. Echo - to be done in 3 months, just prior to next office visit Follow up in  3 months

## 2012-03-29 NOTE — Assessment & Plan Note (Signed)
Recent echocardiogram suggested deterioration in LVF to 25-30%; this was previously assessed as 35-45%. Patient has no known history of CAD. This may be tachycardia mediated, secondary to uncontrolled AF. Recommended reassessment of LVF in 3 months. Continue current medical therapy with beta blocker, but unable to add ACE inhibitor secondary to persistent hypotension.

## 2012-05-25 ENCOUNTER — Telehealth: Payer: Self-pay | Admitting: Internal Medicine

## 2012-05-25 NOTE — Telephone Encounter (Signed)
Called spoke with patient's daughter who stated that pt had a CT done at [facility] on 11.16.13.  Pt was last seen in office by MR 9.30.13 and was rec'd to follow up in 3 months w/ repeat CT chest.  Patient has follow up with MR scheduled 12.19.13, the CT is scheduled 12.20.13.  Asked daughter if she can get a copy of the CT and report for MR to review.  She will do this and states she can bring it to the 12.19.13 ov.  Okay to call her back tomorrow; ok to leave detailed message on machine.  Dr Marchelle Gearing please advise, thanks.

## 2012-05-26 NOTE — Telephone Encounter (Signed)
Pt daughter advised to bring CD-rom to appt on 06-02-12 and we will look-at it and go from there. Pt is not establishing in [place] at this time. Carron Curie, CMA

## 2012-05-26 NOTE — Telephone Encounter (Signed)
1. Why was CT done at [facility] mid nov 2013 ? If that nodule from summer 2013 is not there in the [facility] nov 2013 ct chest, she will not need ct in dec 2013. So, yes she needs to bring that cd rom. They should not have followup CT chest till we review that [facility] ct  2. Plan was for patient to fu with Francella Solian in [place] because of logistics. Why is she set up fu with me ?

## 2012-06-02 ENCOUNTER — Ambulatory Visit (INDEPENDENT_AMBULATORY_CARE_PROVIDER_SITE_OTHER): Payer: Medicare Other | Admitting: Internal Medicine

## 2012-06-02 ENCOUNTER — Encounter: Payer: Self-pay | Admitting: Internal Medicine

## 2012-06-02 VITALS — BP 110/70 | HR 54 | Temp 97.7°F | Ht 64.0 in | Wt 120.0 lb

## 2012-06-02 DIAGNOSIS — R911 Solitary pulmonary nodule: Secondary | ICD-10-CM

## 2012-06-02 NOTE — Progress Notes (Signed)
Subjective:    Patient ID: Casey White, female    DOB: 06/07/1928, 78 y.o.   MRN: [id number]  HPI PCP is Casey B., MD  78 year old female. Body mass index is 20.36 kg/(m^2).  reports that she quit smoking about 46 years ago. She has a 18 pack-year smoking history.    IOV 03/04/2012   - 78 year old female. Accompanied by Dr Casey White her daughter who gives hx.   She was admitted 01/24/12 to 01/27/12 with known hx of A Fib on coumadin and prior TIA but with acute dysphasia.  MRI brain showed acute scattered left MCA infarcts, largest area of involvement in the posterior MCA territory. Superimposed punctate acute or subacute posterior right MCA lacunar infarct. Brain MRA showed anterior circulation atherosclerosis, but no definite stenosis or major branch occlusion correspond to the acute left MCA infarcts. Right ACA irregularity and decreased flow probably corresponds to a combination of atherosclerosis and congenital anatomic variation. 3. Essentially negative posterior circulation. 2D Echocardiogram showed EF 40-45% with no source of embolus. There is akinesis of the distal inferoseptal anteroseptal and apical myocardium and Carotid Doppler showed no internal carotid artery stenosis bilaterally. Vertebrals with antegrade flow bilaterally. Patient was continued on Coumadin and Lipitor. Physical therapist  recommended SNF placement.   Thre are 3 main issues for this visit  1)  Pulmonary nodules: Patient's CXR on 01/25/12, revealed Ill-defined right upper lobe opacity .  Follow up CT scan confirmed scattered subpleural nodules, most prominent in the anterior right middle lobe measuring 8 mm x 5 mm (see results below). So, has been refrred here  2) Also, acute 3 days of green runny nose wit cough and nasal congestion. No fver. No wheeze. Mild- moderate in intensity  3) Their main concern is weight loss x 5 years. Insidious onset. Progressive. Very concerning. Used to weight 160# 5 years ago but  now 130#. This is associated with generalized weakness and poor ECOG of 3-4 (sitting in chair most of the time). She has been  Admitted 2 times for medical issues in this time. Memory is good per daughter. NO associated dysphagia. There is associated systolic CHF diagnosis. Per daughter colonoscopy years ago was negative; currently deferred due to medical issues. Mammogram per hx is normal but not done in "recent times". Thyroid normal per hx. On Vitamin D replacement per hx. Daughter frightened and very concerned about decline esp 65 year old husband also in same SNF. They are looking for answers for pulmonary   CT chest 01/25/12  IMPRESSION:  1. Density in the right upper lobe on chest radiograph represents  an area of subpleural scarring.  2. Scattered subpleural nodules are present, most prominent in the  anterior right middle lobe measuring 8 mm x 5 mm. If the patient is  at high risk for bronchogenic carcinoma, follow-up chest CT at 6-12  months is recommended. If the patient is at low risk for  bronchogenic carcinoma, follow-up chest CT at 12 months is  recommended. This recommendation follows the consensus statement:  Guidelines for Management of Small Pulmonary Nodules Detected on CT  Scans: A Statement from the Fleischner Society as published in  Radiology 2005; 237:395-400.  3. Precarinal lymph node measuring 18 mm short axis is  nonspecific. This may be congestive or reactive. For interlobular  septal thickening is present diffusely, which is nonspecific.  Potentially this could relate to interstitial pulmonary edema in  this patient with cardiomegaly.  Original Report Authenticated By: Casey White  Casey White, M.D.   #Acute sinus infection  Take doxycycline 100mg  po twice daily x 7 days; take after meals and avoid sunlight. If nauseated or vomitting, let us know. Make sure you eat after meals  #Lung nodule  - have CT chest in 3 months at [facility] in [place], [place] and then see Dr  Casey White in [place], [place]  #Weight loss  - likely due to systolic heart failure and general failure to thrive  - the nodule in lung is not cause of weight loss  - please talk more about weight loss with Dr Casey White  - rehab should hopefully help with weight loss and fatigue  #followup  3 months with CT and Dr Casey White in [place], [place]    OV 06/02/2012   Fu lung nodule: had cT chest mid nove 2013 at [facility] while admitted for "dehydration" per daughter (discharge summary seems to confirm the dehydration theory). CT per my review shhows resolution of RML nodule but shows new LUL nodule that is more consistent iwht inflammation. Daugher expressing interest to fu only with followup cxr at [facility] without having to come down to [place] for followup. She will call me with CXR report and decide on  ct based on cxr results and my advice.  In terms of weight loss: this has stabilized. She just completed home PT. She is taking ensure. She is stronger. Getting around. Mild dyspnea related to chronic systolic CHF. Denies cough, edema. Feels well.     Review of Systems  Constitutional: Negative for fever and unexpected weight change.  HENT: Negative for ear pain, nosebleeds, congestion, sore throat, rhinorrhea, sneezing, trouble swallowing, dental problem, postnasal drip and sinus pressure.   Eyes: Negative for redness and itching.  Respiratory: Positive for shortness of breath. Negative for cough, chest tightness and wheezing.   Cardiovascular: Negative for palpitations and leg swelling.  Gastrointestinal: Negative for nausea and vomiting.  Genitourinary: Negative for dysuria.  Musculoskeletal: Negative for joint swelling.  Skin: Negative for rash.  Neurological: Negative for headaches.  Hematological: Does not bruise/bleed easily.  Psychiatric/Behavioral: Negative for dysphoric mood. The patient is not nervous/anxious.        Objective:   Physical Exam Vitals reviewed. Constitutional: She is oriented to  person, place, and time. She appears well-developed and well-nourished. No distress.       Body mass index is 20.36 kg/(m^2). Frail female - looks stage age  HENT:  Head: Normocephalic and atraumatic.  Right Ear: External ear normal.  Left Ear: External ear normal.  Mouth/Throat: Oropharynx is clear and moist. No oropharyngeal exudate.  Eyes: Conjunctivae normal and EOM are normal. Pupils are equal, round, and reactive to light. Right eye exhibits no discharge. Left eye exhibits no discharge. No scleral icterus.  Neck: Normal range of motion. Neck supple. No JVD present. No tracheal deviation present. No thyromegaly present.  Cardiovascular: Normal rate, regular rhythm, normal heart sounds and intact distal pulses.  Exam reveals no gallop and no friction rub.   No murmur heard. Pulmonary/Chest: Effort normal and breath sounds normal. No respiratory distress. She has no wheezes. She has no rales. She exhibits no tenderness.  Abdominal: Soft. Bowel sounds are normal. She exhibits no distension and no mass. There is no tenderness. There is no rebound and no guarding.  Musculoskeletal: Normal range of motion. She exhibits no edema and no tenderness.       OA with heberden nodes +  Lymphadenopathy:    She has no cervical adenopathy.  Neurological: She  is alert and oriented to person, place, and time. She has normal reflexes. No cranial nerve deficit. She exhibits normal muscle tone. Coordination normal.       Flat affect. Normal speech. Withdrawn  Skin: Skin is warm and dry. No rash noted. She is not diaphoretic. No erythema. No pallor.  Psychiatric: She has a normal mood and affect. Her behavior is normal. Judgment and thought content normal.          Assessment & Plan:

## 2012-06-02 NOTE — Patient Instructions (Addendum)
Your right middle lobe lung nodule of August 2013 is resolved as of Nov 2013 but in Nov 2013 there is new lesion Left upper lobe.  The left  Lung nodule is less likely to be cancer but needs followup Please have CXR at [facility] mid-feb 2014; once done call us for review CT scan based on that Return based on above

## 2012-06-03 ENCOUNTER — Other Ambulatory Visit: Payer: Self-pay

## 2012-06-12 NOTE — Assessment & Plan Note (Addendum)
Your right middle lobe lung nodule of August 2013 is resolved as of Nov 2013 but in Nov 2013 there is new lesion Left upper lobe.  The left  Lung nodule is less likely to be cancer but needs followup Please have CXR at [facility] mid-feb 2014; once done call us for review CT scan based on that Return based on above   Above plan created after discussion with daughter as in  Best interest for patient per daughter; shared decision making model   > 50% of this 15 min visit spent in face to face counseling

## 2012-06-22 ENCOUNTER — Other Ambulatory Visit: Payer: Self-pay

## 2012-06-22 ENCOUNTER — Other Ambulatory Visit (INDEPENDENT_AMBULATORY_CARE_PROVIDER_SITE_OTHER): Payer: Medicare Other

## 2012-06-22 DIAGNOSIS — I4891 Unspecified atrial fibrillation: Secondary | ICD-10-CM

## 2012-06-22 DIAGNOSIS — I5022 Chronic systolic (congestive) heart failure: Secondary | ICD-10-CM

## 2012-06-22 DIAGNOSIS — I428 Other cardiomyopathies: Secondary | ICD-10-CM

## 2012-07-01 ENCOUNTER — Telehealth: Payer: Self-pay | Admitting: *Deleted

## 2012-07-01 NOTE — Telephone Encounter (Signed)
Message copied by Lesle Chris on Fri Jul 01, 2012 11:46 AM ------      Message from: Prescott Parma C      Created: Thu Jun 23, 2012  9:38 AM       EF essentially unchanged at 30%; mod RV dysfxn; mod MR. Review results at f/u OV, including discussion re: referral for possible ICD implantation.

## 2012-07-01 NOTE — Telephone Encounter (Signed)
Notes Recorded by Lesle Chris, LPN on 2/72/5366 at 11:46 AM Daughter Belva Bertin) notified. Already has follow up OV scheduled for 1/24 with Dr. Myrtis Ser. ------  Notes Recorded by Lesle Chris, LPN on 09/16/345 at 10:49 AM Left message to return call.

## 2012-07-07 ENCOUNTER — Encounter: Payer: Self-pay | Admitting: Cardiology

## 2012-07-07 DIAGNOSIS — IMO0002 Reserved for concepts with insufficient information to code with codable children: Secondary | ICD-10-CM | POA: Insufficient documentation

## 2012-07-07 DIAGNOSIS — I340 Nonrheumatic mitral (valve) insufficiency: Secondary | ICD-10-CM | POA: Insufficient documentation

## 2012-07-07 DIAGNOSIS — I519 Heart disease, unspecified: Secondary | ICD-10-CM | POA: Insufficient documentation

## 2012-07-07 DIAGNOSIS — R918 Other nonspecific abnormal finding of lung field: Secondary | ICD-10-CM | POA: Insufficient documentation

## 2012-07-07 DIAGNOSIS — R9430 Abnormal result of cardiovascular function study, unspecified: Secondary | ICD-10-CM | POA: Insufficient documentation

## 2012-07-07 DIAGNOSIS — I959 Hypotension, unspecified: Secondary | ICD-10-CM | POA: Insufficient documentation

## 2012-07-08 ENCOUNTER — Encounter: Payer: Self-pay | Admitting: Cardiology

## 2012-07-08 ENCOUNTER — Other Ambulatory Visit: Payer: Self-pay | Admitting: *Deleted

## 2012-07-08 ENCOUNTER — Ambulatory Visit (INDEPENDENT_AMBULATORY_CARE_PROVIDER_SITE_OTHER): Payer: Medicare Other | Admitting: Cardiology

## 2012-07-08 VITALS — BP 113/60 | HR 66 | Ht 65.0 in | Wt 119.0 lb

## 2012-07-08 DIAGNOSIS — R9430 Abnormal result of cardiovascular function study, unspecified: Secondary | ICD-10-CM

## 2012-07-08 DIAGNOSIS — R918 Other nonspecific abnormal finding of lung field: Secondary | ICD-10-CM

## 2012-07-08 DIAGNOSIS — I5020 Unspecified systolic (congestive) heart failure: Secondary | ICD-10-CM

## 2012-07-08 DIAGNOSIS — I4891 Unspecified atrial fibrillation: Secondary | ICD-10-CM

## 2012-07-08 DIAGNOSIS — I6340 Cerebral infarction due to embolism of unspecified cerebral artery: Secondary | ICD-10-CM

## 2012-07-08 DIAGNOSIS — R0989 Other specified symptoms and signs involving the circulatory and respiratory systems: Secondary | ICD-10-CM

## 2012-07-08 DIAGNOSIS — I482 Chronic atrial fibrillation, unspecified: Secondary | ICD-10-CM

## 2012-07-08 DIAGNOSIS — R911 Solitary pulmonary nodule: Secondary | ICD-10-CM

## 2012-07-08 DIAGNOSIS — IMO0002 Reserved for concepts with insufficient information to code with codable children: Secondary | ICD-10-CM

## 2012-07-08 MED ORDER — LOSARTAN POTASSIUM 25 MG PO TABS: 25.0000 mg | ORAL_TABLET | Freq: Every day | ORAL | Status: DC

## 2012-07-08 NOTE — Assessment & Plan Note (Signed)
Patient has recovered very nicely from her CVA. She continues on Coumadin. Her speech was clear today.

## 2012-07-08 NOTE — Assessment & Plan Note (Signed)
Follow up echo reveals that the patient's ejection fraction remains in the 30% range. She had had mild relative hypotension. Systolic pressure today is 113. She is on small amount of beta blocker. I will add a small dose of an ARB, losartan, 25 mg daily.

## 2012-07-08 NOTE — Patient Instructions (Addendum)
Your physician recommends that you schedule a follow-up appointment in: 6 weeks. Your physician has recommended you make the following change in your medication: Start losartan 25 mg daily. Your new prescription has been sent to your pharmacy. All other medications will remain the same. Your physician recommends that you have chest x-ray around the middle of February, 2014 at [facility]. Please go to the main entrance of the hospital when you have this done. A chest x-ray takes a picture of the organs and structures inside the chest, including the heart, lungs, and blood vessels. This test can show several things, including, whether the heart is enlarges; whether fluid is building up in the lungs; and whether pacemaker / defibrillator leads are still in place. PLease take your order with when you go. You don't need an appointment to have this done. A couple of days after this test is completed, please contact Dr. Jane Canary office to see what the follow up plan will be. Please contact their office until you've received a response regarding the plan.

## 2012-07-08 NOTE — Progress Notes (Signed)
HPI  Patient is seen to followup atrial fibrillation and cardiomyopathy in in embolic CVA and some ventricular ectopy. Patient was last seen in the office March 29, 2012. I have reviewed the note from that visit by Mr. Serpe very carefully. I've also reviewed the note from the pulmonary team. There was question of some change in the CT. The pulmonary note says that a chest x-ray is recommended in mid February with results going to Dr. Marchelle Gearing of our team. The patient and her family are not completely clear on this issue. They only no of a 6 month followup visit. My team will be sure that the chest x-ray is ordered at [facility] with the results going to her pulmonary team. The patient will be instructed to call her pulmonary team after the chest x-ray is done.  From a cardiac viewpoint the patient is stable. She feels well. We did do a followup 2-D echo. Her ejection fraction remains 30% from an echo June 22, 2012. We had hoped that she would have improvement in LV function, thinking that her LV dysfunction may have been related to rapid atrial fib. It appears that she has continued LV dysfunction. We will adjust medicines accordingly.  Allergies  Allergen Reactions  . Amoxicillin Other (See Comments)    unknown  . Angiotensin Receptor Blockers Other (See Comments)    hypotension  . Ciprofloxacin   . Codeine   . Codeine Nausea And Vomiting  . Demerol (Meperidine) Nausea And Vomiting  . Lisinopril Cough  . Meperidine Hcl   . Morphine   . Nitrofurantoin   . Sulfonamide Derivatives     Current Outpatient Prescriptions  Medication Sig Dispense Refill  . ALPRAZolam (XANAX) 0.25 MG tablet Take 0.25 mg by mouth at bedtime as needed.        Marland Kitchen atorvastatin (LIPITOR) 20 MG tablet Take 1 tablet (20 mg total) by mouth daily at 6 PM.  30 tablet  0  . buPROPion (WELLBUTRIN) 100 MG tablet Take 100 mg by mouth daily.      Marland Kitchen dicyclomine (BENTYL) 10 MG capsule Take 10 mg by mouth daily.       . diphenoxylate-atropine (LOMOTIL) 2.5-0.025 MG per tablet Take 1 tablet by mouth daily.      . furosemide (LASIX) 20 MG tablet Take 40 mg by mouth daily.       . metoprolol succinate (TOPROL-XL) 50 MG 24 hr tablet Take 50 mg by mouth daily. Take with or immediately following a meal.      . mirtazapine (REMERON) 15 MG tablet Take 15 mg by mouth at bedtime.      . traMADol-acetaminophen (ULTRACET) 37.5-325 MG per tablet Take 1 tablet by mouth every 6 (six) hours as needed.        . warfarin (COUMADIN) 5 MG tablet Take 2.5-5 mg by mouth. Takes 5 mg on Monday, Wednesday , & Fridays and 2.5 mg all the other days of the week.        History   Social History  . Marital Status: Married    Spouse Name: N/A    Number of Children: 2  . Years of Education: N/A   Occupational History  . retired    Social History Main Topics  . Smoking status: Former Smoker -- 1.0 packs/day for 18 years    Types: Cigarettes    Quit date: 11/20/1965  . Smokeless tobacco: Never Used  . Alcohol Use: No  . Drug Use: No  . Sexually  Active: Not on file   Other Topics Concern  . Not on file   Social History Narrative   ** Merged History Encounter **     Family History  Problem Relation Age of Onset  . Colon cancer Father   . Heart attack Brother     Past Medical History  Diagnosis Date  . CHF (congestive heart failure)     WITH MODERATE SYSTOLIC DYSFUNCTION. EF 35-45%  . Chronic atrial fibrillation   . Chronic anticoagulation   . GERD (gastroesophageal reflux disease)   . Depression   . PVCs (premature ventricular contractions)   . TIA (transient ischemic attack)   . Dysrhythmia   . Ejection fraction < 50%     EF 35-45% in the past  //  EF 25-30%, echo, October, 2013  //   EF  30%, echo, June 22, 2012, septal dyssynergy, akinetic inferolateral wall,  moderate dilatation of the left ventricle, diffuse hypokinesis,  . Mitral regurgitation     Moderate, echo, January, 2014  . Right ventricular  dysfunction     Moderate, echo, June 22, 2012  . Lung nodules     October, 2013, Dr. Marchelle Gearing  . Hypotension     Relative hypotension, office, October, 2013    Past Surgical History  Procedure Date  . Knee surgery     LEFT KNEE  . Intraocular lens implant, secondary     bilateral  . Abdominal hysterectomy   . Appendectomy     Patient Active Problem List  Diagnosis  . CONGESTIVE HEART FAILURE  . ESOPHAGEAL REFLUX  . COUGH  . ANGINA, HX OF  . Chronic a-fib  . Expressive aphasia  . Atrial fibrillation  . Hypokalemia  . Embolic cerebral infarction  . Weight loss  . Acute sinusitis  . Cardiomyopathy  . Ventricular ectopy  . Ejection fraction < 50%  . Mitral regurgitation  . Right ventricular dysfunction  . Lung nodules  . Hypotension    ROS   Patient is oriented to person time and place. Affect is normal. Lungs are clear. Respiratory effort is nonlabored. She is thin but stable. Cardiac exam reveals S1-S2. There no clicks or significant murmurs. The abdomen is soft. There is no peripheral edema. There no musculoskeletal deformities. There are no skin rashes.  PHYSICAL EXAM  Patient is here with family member. She is oriented to person time and place. Affect is normal. There is no jugulovenous distention. Lungs reveal no scattered rhonchi. She has kyphosis of the spine. There is no respiratory distress. Cardiac exam reveals S1 and S2. There no clicks or significant murmurs. The abdomen is soft. There is no peripheral edema. There no musculoskeletal deformities. There are no skin rashes.  Filed Vitals:   07/08/12 1300  BP: 113/60  Pulse: 66  Height: 5\' 5"  (1.651 m)  Weight: 119 lb (53.978 kg)     ASSESSMENT & PLAN

## 2012-07-08 NOTE — Assessment & Plan Note (Signed)
Her volume status is controlled at this time. No change in therapy other than having an ARB.

## 2012-07-08 NOTE — Assessment & Plan Note (Signed)
I will be sure that a chest x-ray is ordered for mid February with results to go to her pulmonary team and follow up with the pulmonary team.  Today I spent greater than 25 minutes with the patient. More than half of this time was spent with direct care face-to-face with the patient and her family member. Some of this was related to her cardiac status and some was related to trying to be sure that her pulmonary status was being managed completely. The patient and her family did not know she was to have a followup chest x-ray.

## 2012-07-08 NOTE — Assessment & Plan Note (Signed)
There is chronic atrial fibrillation. The rate is controlled. She is on Coumadin. No change in therapy.

## 2012-08-17 ENCOUNTER — Encounter: Payer: Self-pay | Admitting: Cardiology

## 2012-08-17 ENCOUNTER — Ambulatory Visit (INDEPENDENT_AMBULATORY_CARE_PROVIDER_SITE_OTHER): Payer: Medicare Other | Admitting: Cardiology

## 2012-08-17 VITALS — BP 96/55 | HR 66 | Ht 65.0 in | Wt 126.0 lb

## 2012-08-17 DIAGNOSIS — I059 Rheumatic mitral valve disease, unspecified: Secondary | ICD-10-CM

## 2012-08-17 DIAGNOSIS — I340 Nonrheumatic mitral (valve) insufficiency: Secondary | ICD-10-CM

## 2012-08-17 DIAGNOSIS — R918 Other nonspecific abnormal finding of lung field: Secondary | ICD-10-CM

## 2012-08-17 DIAGNOSIS — I6340 Cerebral infarction due to embolism of unspecified cerebral artery: Secondary | ICD-10-CM

## 2012-08-17 DIAGNOSIS — I509 Heart failure, unspecified: Secondary | ICD-10-CM

## 2012-08-17 DIAGNOSIS — I4891 Unspecified atrial fibrillation: Secondary | ICD-10-CM

## 2012-08-17 DIAGNOSIS — I429 Cardiomyopathy, unspecified: Secondary | ICD-10-CM

## 2012-08-17 DIAGNOSIS — I482 Chronic atrial fibrillation, unspecified: Secondary | ICD-10-CM

## 2012-08-17 DIAGNOSIS — I5022 Chronic systolic (congestive) heart failure: Secondary | ICD-10-CM

## 2012-08-17 DIAGNOSIS — I428 Other cardiomyopathies: Secondary | ICD-10-CM

## 2012-08-17 NOTE — Assessment & Plan Note (Signed)
I outlined in the first part of this note that I am concerned about the abnormal chest x-ray. I explained this carefully to the patient's daughter. I will be ordering a followup CT scan and a followup visit with Dr. Marchelle Gearing of the pulmonary team.

## 2012-08-17 NOTE — Assessment & Plan Note (Signed)
At this point I cannot push her medicines any further. No change in therapy.

## 2012-08-17 NOTE — Assessment & Plan Note (Signed)
She has moderate mitral regurgitation. No change in therapy.

## 2012-08-17 NOTE — Progress Notes (Signed)
HPI  Patient is seen today to followup atrial fibrillation and cardiomyopathy and embolic CVA in ventricular ectopy and abnormal chest x-ray. I saw the patient last January, 2014. At that time I noted that her atrial fib rate was controlled. She is on Coumadin. She had had an embolic CVA. Her Coumadin was stabilizing that situation. She has a reduced ejection fraction. Her meds have been pushed as high as we can go. She has had relative hypotension. Her volume status has been stable. She's not having any significant shortness of breath or chest pain.  In my note of July 08, 2012, I mentioned that there had been an abnormal chest x-ray and that further evaluation was in order. I plan for her to followup with a chest x-ray and then followup with her pulmonary doctor. We called her before today's visit and her daughter took her for her chest x-ray today. The film is abnormal. The previously noted pulmonary nodule in the left apex is again noted, but cannot be directly compared to the prior CT of November, 2013. It is recommended in the radiology report that she have a followup CT.  Allergies  Allergen Reactions  . Amoxicillin Other (See Comments)    unknown  . Angiotensin Receptor Blockers Other (See Comments)    hypotension  . Ciprofloxacin   . Codeine   . Codeine Nausea And Vomiting  . Demerol (Meperidine) Nausea And Vomiting  . Lisinopril Cough  . Meperidine Hcl   . Morphine   . Nitrofurantoin   . Sulfonamide Derivatives     Current Outpatient Prescriptions  Medication Sig Dispense Refill  . ALPRAZolam (XANAX) 0.25 MG tablet Take 0.25 mg by mouth at bedtime as needed.        Marland Kitchen atorvastatin (LIPITOR) 20 MG tablet Take 1 tablet (20 mg total) by mouth daily at 6 PM.  30 tablet  0  . buPROPion (WELLBUTRIN) 100 MG tablet Take 100 mg by mouth daily.      Marland Kitchen dicyclomine (BENTYL) 10 MG capsule Take 10 mg by mouth daily.      . diphenoxylate-atropine (LOMOTIL) 2.5-0.025 MG per tablet Take 1  tablet by mouth daily.      . furosemide (LASIX) 20 MG tablet Take 40 mg by mouth daily.       Marland Kitchen losartan (COZAAR) 25 MG tablet Take 1 tablet (25 mg total) by mouth daily.  90 tablet  3  . metoprolol succinate (TOPROL-XL) 50 MG 24 hr tablet Take 50 mg by mouth daily. Take with or immediately following a meal.      . mirtazapine (REMERON) 15 MG tablet Take 15 mg by mouth at bedtime.      . traMADol-acetaminophen (ULTRACET) 37.5-325 MG per tablet Take 1 tablet by mouth every 6 (six) hours as needed.        . warfarin (COUMADIN) 5 MG tablet Take 2.5-5 mg by mouth. Takes 5 mg on Monday, Wednesday , & Fridays and 2.5 mg all the other days of the week.       No current facility-administered medications for this visit.    History   Social History  . Marital Status: Married    Spouse Name: N/A    Number of Children: 2  . Years of Education: N/A   Occupational History  . retired    Social History Main Topics  . Smoking status: Former Smoker -- 1.00 packs/day for 18 years    Types: Cigarettes    Quit date: 11/20/1965  .  Smokeless tobacco: Never Used  . Alcohol Use: No  . Drug Use: No  . Sexually Active: Not on file   Other Topics Concern  . Not on file   Social History Narrative   ** Merged History Encounter **        Family History  Problem Relation Age of Onset  . Colon cancer Father   . Heart attack Brother     Past Medical History  Diagnosis Date  . Systolic CHF     WITH MODERATE SYSTOLIC DYSFUNCTION. EF 35-45%  . Chronic atrial fibrillation   . Chronic anticoagulation   . GERD (gastroesophageal reflux disease)   . Depression   . PVCs (premature ventricular contractions)   . TIA (transient ischemic attack)   . Dysrhythmia   . Ejection fraction < 50%     EF 35-45% in the past  //  EF 25-30%, echo, October, 2013  //   EF  30%, echo, June 22, 2012, septal dyssynergy, akinetic inferolateral wall,  moderate dilatation of the left ventricle, diffuse hypokinesis,  .  Mitral regurgitation     Moderate, echo, January, 2014  . Right ventricular dysfunction     Moderate, echo, June 22, 2012  . Lung nodules     October, 2013, Dr. Marchelle Gearing  . Hypotension     Relative hypotension, office, October, 2013    Past Surgical History  Procedure Laterality Date  . Knee surgery      LEFT KNEE  . Intraocular lens implant, secondary      bilateral  . Abdominal hysterectomy    . Appendectomy      Patient Active Problem List  Diagnosis  . CONGESTIVE HEART FAILURE  . ESOPHAGEAL REFLUX  . COUGH  . ANGINA, HX OF  . Chronic a-fib  . Expressive aphasia  . Atrial fibrillation  . Hypokalemia  . Embolic cerebral infarction  . Weight loss  . Acute sinusitis  . Cardiomyopathy  . Ventricular ectopy  . Ejection fraction < 50%  . Mitral regurgitation  . Right ventricular dysfunction  . Lung nodules  . Hypotension  . Systolic CHF    ROS    Patient denies fever, chills, headache, sweats, rash, change in vision, change in hearing, chest pain, cough, nausea or vomiting, urinary symptoms. All other systems are reviewed and are negative.  PHYSICAL EXAM   The patient is here with her family. She has kyphosis of the spine. She is oriented to person time and place. Affect is normal. Lungs reveal decreased breath sounds. There are no rales heard. There is no respiratory distress. Cardiac exam reveals S1 and S2. There no clicks or significant murmurs. The abdomen is soft. There is no significant peripheral edema at this time. There are no skin rashes.  Filed Vitals:   08/17/12 1347  BP: 96/55  Pulse: 66  Height: 5\' 5"  (1.651 m)  Weight: 126 lb (57.153 kg)  SpO2: 100%    EKG  ASSESSMENT & PLAN

## 2012-08-17 NOTE — Assessment & Plan Note (Signed)
The patient has chronic systolic CHF. This is controlled. No change in therapy today.

## 2012-08-17 NOTE — Assessment & Plan Note (Signed)
Her atrial fib bradycardia is controlled. No change in therapy.

## 2012-08-17 NOTE — Patient Instructions (Addendum)
Your physician recommends that you schedule a follow-up appointment in: 3 months. Your physician recommends that you continue on your current medications as directed. Please refer to the Current Medication list given to you today. Non-Cardiac CT chest scanning, (CAT scanning), is a noninvasive, special x-ray that produces cross-sectional images of the body using x-rays and a computer. CT scans help physicians diagnose and treat medical conditions. For some CT exams, a contrast material is used to enhance visibility in the area of the body being studied. CT scans provide greater clarity and reveal more details than regular x-ray exams. Refer to Dr. Marchelle Gearing. Your physician recommends that you return for lab work for [employer] at [facility]

## 2012-08-17 NOTE — Assessment & Plan Note (Signed)
She has recovered well from her embolic CVA. She continues on Coumadin and she's doing well.

## 2012-08-18 ENCOUNTER — Telehealth: Payer: Self-pay | Admitting: Cardiology

## 2012-08-18 NOTE — Telephone Encounter (Signed)
No precert required 

## 2012-08-18 NOTE — Telephone Encounter (Signed)
CT Chest with contrast set for 08-24-12 @ [facility] Checking percert

## 2012-08-19 ENCOUNTER — Encounter: Payer: Self-pay | Admitting: Cardiology

## 2012-08-19 NOTE — Progress Notes (Signed)
     When I saw the patient recently arrange for followup chest x-ray. I had a very careful discussion with the patient and her daughter about following up the issue of her pulmonary nodule. I arranged for a followup CT scan. I arranged for a followup visit for continued pulmonary evaluation with Dr. Marchelle Gearing. All of this should now be in place.  Jerral Bonito, MD

## 2012-08-24 ENCOUNTER — Encounter: Payer: Self-pay | Admitting: Internal Medicine

## 2012-08-24 ENCOUNTER — Ambulatory Visit (INDEPENDENT_AMBULATORY_CARE_PROVIDER_SITE_OTHER): Payer: Medicare Other | Admitting: Internal Medicine

## 2012-08-24 VITALS — BP 110/66 | HR 106 | Temp 97.4°F | Ht 64.0 in | Wt 128.0 lb

## 2012-08-24 DIAGNOSIS — R918 Other nonspecific abnormal finding of lung field: Secondary | ICD-10-CM

## 2012-08-24 NOTE — Assessment & Plan Note (Signed)
The lung nodule is resolved. No further followup needed

## 2012-08-24 NOTE — Progress Notes (Signed)
Subjective:    Patient ID: Casey White, female    DOB: March 15, 1928, 79 y.o.   MRN: [id number]  HPI PCP is VYAS,DHRUV B., MD  79 year old female. Body mass index is 20.36 kg/(m^2).  reports that she quit smoking about 46 years ago. She has a 18 pack-year smoking history.    IOV 03/04/2012   - 79 year old female. Accompanied by Dr Belva Bertin her daughter who gives hx.   She was admitted 01/24/12 to 01/27/12 with known hx of A Fib on coumadin and prior TIA but with acute dysphasia.  MRI brain showed acute scattered left MCA infarcts, largest area of involvement in the posterior MCA territory. Superimposed punctate acute or subacute posterior right MCA lacunar infarct. Brain MRA showed anterior circulation atherosclerosis, but no definite stenosis or major branch occlusion correspond to the acute left MCA infarcts. Right ACA irregularity and decreased flow probably corresponds to a combination of atherosclerosis and congenital anatomic variation. 3. Essentially negative posterior circulation. 2D Echocardiogram showed EF 40-45% with no source of embolus. There is akinesis of the distal inferoseptal anteroseptal and apical myocardium and Carotid Doppler showed no internal carotid artery stenosis bilaterally. Vertebrals with antegrade flow bilaterally. Patient was continued on Coumadin and Lipitor. Physical therapist  recommended SNF placement.   Thre are 3 main issues for this visit  1)  Pulmonary nodules: Patient's CXR on 01/25/12, revealed Ill-defined right upper lobe opacity .  Follow up CT scan confirmed scattered subpleural nodules, most prominent in the anterior right middle lobe measuring 8 mm x 5 mm (see results below). So, has been refrred here  2) Also, acute 3 days of green runny nose wit cough and nasal congestion. No fver. No wheeze. Mild- moderate in intensity  3) Their main concern is weight loss x 5 years. Insidious onset. Progressive. Very concerning. Used to weight 160# 5 years ago but  now 130#. This is associated with generalized weakness and poor ECOG of 3-4 (sitting in chair most of the time). She has been  Admitted 2 times for medical issues in this time. Memory is good per daughter. NO associated dysphagia. There is associated systolic CHF diagnosis. Per daughter colonoscopy years ago was negative; currently deferred due to medical issues. Mammogram per hx is normal but not done in "recent times". Thyroid normal per hx. On Vitamin D replacement per hx. Daughter frightened and very concerned about decline esp 67 year old husband also in same SNF. They are looking for answers for pulmonary   CT chest 01/25/12  IMPRESSION:  1. Density in the right upper lobe on chest radiograph represents  an area of subpleural scarring.  2. Scattered subpleural nodules are present, most prominent in the  anterior right middle lobe measuring 8 mm x 5 mm. If the patient is  at high risk for bronchogenic carcinoma, follow-up chest CT at 6-12  months is recommended. If the patient is at low risk for  bronchogenic carcinoma, follow-up chest CT at 12 months is  recommended. This recommendation follows the consensus statement:  Guidelines for Management of Small Pulmonary Nodules Detected on CT  Scans: A Statement from the Fleischner Society as published in  Radiology 2005; 237:395-400.  3. Precarinal lymph node measuring 18 mm short axis is  nonspecific. This may be congestive or reactive. For interlobular  septal thickening is present diffusely, which is nonspecific.  Potentially this could relate to interstitial pulmonary edema in  this patient with cardiomegaly.  Original Report Authenticated By: Juliene Pina  [name], M.D.   #Acute sinus infection  Take doxycycline 100mg  po twice daily x 7 days; take after meals and avoid sunlight. If nauseated or vomitting, let us know. Make sure you eat after meals  #Lung nodule  - have CT chest in 3 months at [facility] in [place], [place] and then see Dr  Sherene Sires in [place], [place]  #Weight loss  - likely due to systolic heart failure and general failure to thrive  - the nodule in lung is not cause of weight loss  - please talk more about weight loss with Dr Sherril Croon  - rehab should hopefully help with weight loss and fatigue  #followup  3 months with CT and Dr Sherene Sires in [place], [place]    OV 06/02/2012   Fu lung nodule: had cT chest mid nove 2013 at [facility] while admitted for "dehydration" per daughter (discharge summary seems to confirm the dehydration theory). CT per my review shhows resolution of RML nodule but shows new LUL nodule that is more consistent iwht inflammation. Daugher expressing interest to fu only with followup cxr at [facility] without having to come down to [place] for followup. She will call me with CXR report and decide on  ct based on cxr results and my advice.  In terms of weight loss: this has stabilized. She just completed home PT. She is taking ensure. She is stronger. Getting around. Mild dyspnea related to chronic systolic CHF. Denies cough, edema. Feels well.    REC Your right middle lobe lung nodule of August 2013 is resolved as of Nov 2013 but in Nov 2013 there is new lesion Left upper lobe.  The left Lung nodule is less likely to be cancer but needs followup  Please have CXR at [facility] mid-feb 2014; once done call us for review  CT scan based on that  Return based on above  OV 08/24/2012   followup pulmonary nodule Had CT scan of the chest at [facility] and read by the local radiologist. This was done 08/22/2012. The left upper lobe nodule of November 2013 has resolved. There is no other new pulmonary finding . The other  pulmonary findings of biapical scarring and emphysematous changes unchanged. She and her daughter maintains that she is doing well. She's gained weight. She denies any respiratory complaints. The only issue is some right upper quadrant on and off pain that is worsened with deep palpation that is mild  without any aggravating or relieving factors and been present for few weeks without any progression. I've asked her to discuss this with her primary care physician   No results found.    Current outpatient prescriptions:ALPRAZolam (XANAX) 0.25 MG tablet, Take 0.25 mg by mouth at bedtime as needed.  , Disp: , Rfl: ;  atorvastatin (LIPITOR) 20 MG tablet, Take 1 tablet (20 mg total) by mouth daily at 6 PM., Disp: 30 tablet, Rfl: 0;  buPROPion (WELLBUTRIN) 100 MG tablet, Take 100 mg by mouth daily., Disp: , Rfl: ;  dicyclomine (BENTYL) 10 MG capsule, Take 10 mg by mouth daily., Disp: , Rfl:  diphenoxylate-atropine (LOMOTIL) 2.5-0.025 MG per tablet, Take 1 tablet by mouth daily., Disp: , Rfl: ;  furosemide (LASIX) 20 MG tablet, Take 40 mg by mouth daily. , Disp: , Rfl: ;  losartan (COZAAR) 25 MG tablet, Take 1 tablet (25 mg total) by mouth daily., Disp: 90 tablet, Rfl: 3;  metoprolol succinate (TOPROL-XL) 50 MG 24 hr tablet, Take 50 mg by mouth daily. Take with or immediately  following a meal., Disp: , Rfl:  mirtazapine (REMERON) 15 MG tablet, Take 15 mg by mouth at bedtime., Disp: , Rfl: ;  traMADol-acetaminophen (ULTRACET) 37.5-325 MG per tablet, Take 1 tablet by mouth every 6 (six) hours as needed.  , Disp: , Rfl: ;  warfarin (COUMADIN) 5 MG tablet, Take 2.5-5 mg by mouth. Takes 5 mg on Monday, Wednesday , & Fridays and 2.5 mg all the other days of the week., Disp: , Rfl:     Review of Systems  Constitutional: Negative for fever and unexpected weight change.  HENT: Negative for ear pain, nosebleeds, congestion, sore throat, rhinorrhea, sneezing, trouble swallowing, dental problem, postnasal drip and sinus pressure.   Eyes: Negative for redness and itching.  Respiratory: Negative for cough, chest tightness, shortness of breath and wheezing.   Cardiovascular: Negative for palpitations and leg swelling.  Gastrointestinal: Negative for nausea and vomiting.  Genitourinary: Negative for dysuria.   Musculoskeletal: Negative for joint swelling.  Skin: Negative for rash.  Neurological: Negative for headaches.  Hematological: Does not bruise/bleed easily.  Psychiatric/Behavioral: Negative for dysphoric mood. The patient is not nervous/anxious.        Objective:   Physical Exam Vitals reviewed. Constitutional: She is oriented to person, place, and time. She appears well-developed and well-nourished. No distress.       Body mass index is 20.36 kg/(m^2).06/02/2012 Body mass index is 21.96 kg/(m^2). on 08/24/2012   Frail female - looks stage age  HENT:  Head: Normocephalic and atraumatic.  Right Ear: External ear normal.  Left Ear: External ear normal.  Mouth/Throat: Oropharynx is clear and moist. No oropharyngeal exudate.  Eyes: Conjunctivae normal and EOM are normal. Pupils are equal, round, and reactive to light. Right eye exhibits no discharge. Left eye exhibits no discharge. No scleral icterus.  Neck: Normal range of motion. Neck supple. No JVD present. No tracheal deviation present. No thyromegaly present.  Cardiovascular: Normal rate, regular rhythm, normal heart sounds and intact distal pulses.  Exam reveals no gallop and no friction rub.   No murmur heard. Pulmonary/Chest: Effort normal and breath sounds normal. No respiratory distress. She has no wheezes. She has no rales. She exhibits no tenderness.  Abdominal: Soft. Bowel sounds are normal. She exhibits no distension and no mass. There is no tenderness. There is no rebound and no guarding. I palpated her right upper quadrant. She says that is the site of the pain but at this point in time she does not have tenderness  Musculoskeletal: Normal range of motion. She exhibits no edema and no tenderness.       OA with heberden nodes +  Lymphadenopathy:    She has no cervical adenopathy.  Neurological: She is alert and oriented to person, place, and time. She has normal reflexes. No cranial nerve deficit. She exhibits normal  muscle tone. Coordination normal.   careful this visit and not withdrawn t. Normal speech.  Skin: Skin is warm and dry. No rash noted. She is not diaphoretic. No erythema. No pallor.  Psychiatric: She has a normal mood and affect. Her behavior is normal. Judgment and thought content normal.          Assessment & Plan:

## 2012-08-24 NOTE — Patient Instructions (Addendum)
#  Lung nodules - All completely resolved. No further followup needed  #Right upper abdomen pain Please talk to primary care physician  #Followup No further followup of pulmonary but can determine if you have new problems

## 2012-11-18 ENCOUNTER — Encounter: Payer: Self-pay | Admitting: Cardiology

## 2012-11-18 ENCOUNTER — Ambulatory Visit (INDEPENDENT_AMBULATORY_CARE_PROVIDER_SITE_OTHER): Payer: Medicare Other | Admitting: Cardiology

## 2012-11-18 VITALS — BP 90/55 | HR 66 | Ht 65.0 in | Wt 130.0 lb

## 2012-11-18 DIAGNOSIS — I4891 Unspecified atrial fibrillation: Secondary | ICD-10-CM

## 2012-11-18 DIAGNOSIS — I959 Hypotension, unspecified: Secondary | ICD-10-CM

## 2012-11-18 DIAGNOSIS — I509 Heart failure, unspecified: Secondary | ICD-10-CM

## 2012-11-18 MED ORDER — METOPROLOL SUCCINATE ER 25 MG PO TB24: 25.0000 mg | ORAL_TABLET | Freq: Every day | ORAL | Status: DC

## 2012-11-18 MED ORDER — LOSARTAN POTASSIUM 25 MG PO TABS: 12.5000 mg | ORAL_TABLET | Freq: Every day | ORAL | Status: DC

## 2012-11-18 NOTE — Patient Instructions (Addendum)
Your physician recommends that you schedule a follow-up appointment in: 4 months. You will receive a reminder letter in the mail in about 2 months reminding you to call and schedule your appointment. If you don't receive this letter, please contact our office. Your physician has recommended you make the following change in your medication: Decreased your toprol xl to 25 mg daily. You may break your 50 mg tablet in 1/2 daily until finished. Also, your losartan has been decreased to 12.5 mg daily. Please break your 25 mg tablet in 1/2 daily. Your new prescriptions have been sent to your pharmacy. All other medications will remain the same.

## 2012-11-18 NOTE — Assessment & Plan Note (Signed)
Her volume status is stable. Ejection fraction was 30% by echo January, 2014. I'm not able to push her medicines any further. In fact I have to decrease and because of her relative hypotension with some symptoms.

## 2012-11-18 NOTE — Progress Notes (Signed)
HPI   Patient is seen in followup atrial fibrillation cardiomyopathy and embolic CVA. When I saw her last March 5, 201 for her overall cardiac status was stable. There had been concern about abnormal chest x-ray and we had a CT scan done. She also had followup with the pulmonary team. The CT scan had normalized and she needed no further workup.  She's here with her daughter. She does have overall fatigue. She has not had any syncope. She may have had some mild presyncope. I know that she has seen her primary physician. Her daughter reports that labs were checked and they were okay. Her blood pressure is relatively low at this time.  Allergies  Allergen Reactions  . Amoxicillin Other (See Comments)    unknown  . Angiotensin Receptor Blockers Other (See Comments)    hypotension  . Ciprofloxacin   . Codeine   . Codeine Nausea And Vomiting  . Demerol (Meperidine) Nausea And Vomiting  . Lisinopril Cough  . Meperidine Hcl   . Morphine   . Nitrofurantoin   . Sulfonamide Derivatives     Current Outpatient Prescriptions  Medication Sig Dispense Refill  . ALPRAZolam (XANAX) 0.25 MG tablet Take 0.25 mg by mouth at bedtime as needed.        Marland Kitchen atorvastatin (LIPITOR) 20 MG tablet Take 1 tablet (20 mg total) by mouth daily at 6 PM.  30 tablet  0  . buPROPion (WELLBUTRIN) 100 MG tablet Take 100 mg by mouth daily.      Marland Kitchen dicyclomine (BENTYL) 10 MG capsule Take 10 mg by mouth daily.      . diphenoxylate-atropine (LOMOTIL) 2.5-0.025 MG per tablet Take 1 tablet by mouth daily.      . furosemide (LASIX) 20 MG tablet Take 40 mg by mouth daily.       Marland Kitchen losartan (COZAAR) 25 MG tablet Take 1 tablet (25 mg total) by mouth daily.  90 tablet  3  . metoprolol succinate (TOPROL-XL) 50 MG 24 hr tablet Take 50 mg by mouth daily. Take with or immediately following a meal.      . mirtazapine (REMERON) 15 MG tablet Take 15 mg by mouth at bedtime.      . traMADol-acetaminophen (ULTRACET) 37.5-325 MG per tablet Take  1 tablet by mouth every 6 (six) hours as needed.        . warfarin (COUMADIN) 5 MG tablet Take 2.5-5 mg by mouth. Takes 5 mg on Monday, Wednesday , & Fridays and 2.5 mg all the other days of the week.       No current facility-administered medications for this visit.    History   Social History  . Marital Status: Married    Spouse Name: N/A    Number of Children: 2  . Years of Education: N/A   Occupational History  . retired    Social History Main Topics  . Smoking status: Former Smoker -- 1.00 packs/day for 18 years    Types: Cigarettes    Quit date: 11/20/1965  . Smokeless tobacco: Never Used  . Alcohol Use: No  . Drug Use: No  . Sexually Active: Not on file   Other Topics Concern  . Not on file   Social History Narrative   ** Merged History Encounter **        Family History  Problem Relation Age of Onset  . Colon cancer Father   . Heart attack Brother     Past Medical History  Diagnosis Date  .  Systolic CHF     WITH MODERATE SYSTOLIC DYSFUNCTION. EF 35-45%  . Chronic atrial fibrillation   . Chronic anticoagulation   . GERD (gastroesophageal reflux disease)   . Depression   . PVCs (premature ventricular contractions)   . TIA (transient ischemic attack)   . Dysrhythmia   . Ejection fraction < 50%     EF 35-45% in the past  //  EF 25-30%, echo, October, 2013  //   EF  30%, echo, June 22, 2012, septal dyssynergy, akinetic inferolateral wall,  moderate dilatation of the left ventricle, diffuse hypokinesis,  . Mitral regurgitation     Moderate, echo, January, 2014  . Right ventricular dysfunction     Moderate, echo, June 22, 2012  . Lung nodules     October, 2013, Dr. Marchelle Gearing  . Hypotension     Relative hypotension, office, October, 2013    Past Surgical History  Procedure Laterality Date  . Knee surgery      LEFT KNEE  . Intraocular lens implant, secondary      bilateral  . Abdominal hysterectomy    . Appendectomy      Patient Active  Problem List   Diagnosis Date Noted  . Systolic CHF   . Ejection fraction < 50%   . Mitral regurgitation   . Right ventricular dysfunction   . Lung nodules   . Hypotension   . Cardiomyopathy 03/29/2012  . Ventricular ectopy 03/29/2012  . Weight loss 03/04/2012  . Acute sinusitis 03/04/2012  . 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 Hypokalemia 01/25/2012  . Expressive aphasia 01/24/2012  . Atrial fibrillation 01/24/2012  . Chronic a-fib 11/24/2010  . ESOPHAGEAL REFLUX 12/20/2007  . CONGESTIVE HEART FAILURE 11/29/2007  . COUGH 11/29/2007  . ANGINA, HX OF 11/29/2007    ROS   Patient denies fever, chills, headache, sweats, rash, change in vision, change in hearing, chest pain, cough, nausea vomiting, urinary symptoms. All other systems are reviewed and are negative.  PHYSICAL EXAM  Patient is here with her daughter. She is oriented to person time and place. Affect is normal. Lung exam reveal scattered rhonchi. There is no respiratory distress. Cardiac exam reveals S1 and S2. The abdomen is soft. There is no significant peripheral edema.  Filed Vitals:   11/18/12 1328  BP: 90/55  Pulse: 66  Height: 5\' 5"  (1.651 m)  Weight: 130 lb (58.968 kg)     ASSESSMENT & PLAN

## 2012-11-18 NOTE — Assessment & Plan Note (Signed)
She is having relative hypotension and we will cut her medicines down further.

## 2012-11-18 NOTE — Assessment & Plan Note (Signed)
This is being managed carefully but we cannot use a great deal of medications.

## 2012-11-18 NOTE — Assessment & Plan Note (Signed)
She is coumadinize. Her rate is controlled. If anything it may be on the slow side. I will oh her her metoprolol to only 25 mg daily.

## 2012-12-09 ENCOUNTER — Encounter: Payer: Self-pay | Admitting: Cardiology

## 2012-12-09 ENCOUNTER — Encounter: Payer: Self-pay | Admitting: Internal Medicine

## 2012-12-29 ENCOUNTER — Ambulatory Visit: Payer: Self-pay | Admitting: Nurse Practitioner

## 2013-02-02 ENCOUNTER — Telehealth: Payer: Self-pay | Admitting: *Deleted

## 2013-02-02 NOTE — Telephone Encounter (Signed)
Called the patient daughter see if she could bring the patient in on 02-07-13 @ 230 instead on 02-06-13. Already scheduled patient just waiting on a call back.

## 2013-02-03 NOTE — Telephone Encounter (Signed)
Daughter returned  Call patient will come in on 02-07-13 instead of 02-06-13. Patient will see Larita Fife.

## 2013-02-06 ENCOUNTER — Ambulatory Visit: Payer: Self-pay | Admitting: Nurse Practitioner

## 2013-02-07 ENCOUNTER — Encounter: Payer: Self-pay | Admitting: Nurse Practitioner

## 2013-02-07 ENCOUNTER — Ambulatory Visit (INDEPENDENT_AMBULATORY_CARE_PROVIDER_SITE_OTHER): Payer: Medicare Other | Admitting: Nurse Practitioner

## 2013-02-07 VITALS — BP 115/69 | HR 60 | Temp 97.6°F | Ht 67.0 in | Wt 135.0 lb

## 2013-02-07 DIAGNOSIS — I639 Cerebral infarction, unspecified: Secondary | ICD-10-CM

## 2013-02-07 DIAGNOSIS — E785 Hyperlipidemia, unspecified: Secondary | ICD-10-CM

## 2013-02-07 DIAGNOSIS — R0989 Other specified symptoms and signs involving the circulatory and respiratory systems: Secondary | ICD-10-CM

## 2013-02-07 DIAGNOSIS — I6350 Cerebral infarction due to unspecified occlusion or stenosis of unspecified cerebral artery: Secondary | ICD-10-CM

## 2013-02-07 DIAGNOSIS — I4891 Unspecified atrial fibrillation: Secondary | ICD-10-CM

## 2013-02-07 NOTE — Patient Instructions (Addendum)
Continue warfarin  for atrial fibrillation and secondary stroke prevention and maintain strict control of hypertension with blood pressure goal below 130/90, diabetes with hemoglobin A1c goal below 6.5% and lipids with LDL cholesterol goal below 100 mg/dL.    We recommend that you switch from Coumadin to one of the newer anticoagulants.  Check with your pharmacy or Dr. Henrietta Hoover office to see which would be cheaper.  Pradaxa,  Xarelto, and Eliquis.   Followup in the future with me in 2-3 months.

## 2013-02-07 NOTE — Progress Notes (Signed)
[facility]  PATIENT: Casey White DOB: 1927/12/20   HISTORY FROM: patient, chart REASON FOR VISIT: routine follow up  HISTORY OF PRESENT ILLNESS:  PRIOR HPI 06/30/12 (PS):  79 year old Caucasian female here for 2 month follow up bilateral MCA infarct likely due to atrial fibrillation inspite of therapeutic warfarin INR 2.2.  Vascular risk factor of atrial fibrillation, hyperlipidemia, previous TIA and age.   Fall risk 14.    She returns for followup after last visit on 04/01/29 in. She continues to do well and has not had any neurovascular symptoms following her stroke in August 2013. She feels she is almost back to her baseline but has only mild speech difficulties particularly when she's trying to talk fast her words get messed up. She also has been some speech difficulties when she is tired. She states her gait and balance are okay and she's not had any recent falls. She is tolerating warfarin without significant bleeding, bruising oor other side effects. She had lab work done last fall and feels lipid profile was also checked though she is unable to 10 minutes hours. She lives at home and able to do most activities for herself her grand son lives with her and daughter lives right nextdoor. She has no new complaints today.  UPDATE 02/07/13 (LL):  Patient returns for regularly scheduled stroke follow up but has suffered recent stroke on 01/01/13.  She reports waking up in the morning of 07/20 and having blurred vision and felt weak but she did not tell anyone until 4 days later.  Her daughter says she had some slurred speech when she called her, and she was taken to [facility] in [place] and a CT head revealed a large right occipital stroke.  She was admitted overnight and released.  She had an outpatient MRI head 1 week later.  She has no residual weakness or sensory deficits or speech difficulties, but lost some left peripheral vision.  She states she is more fatigued  now.  She reports that she has been on Coumadin for the last 10 years and it has been difficult to control.  She has an appointment with Dr. Myrtis Ser in October in [place], but her PCP Dr. Sherril Croon monitors her Coumadin.   Patient denies medication side effects, with no signs of bleeding or excessive bruising.  She states her blood pressure is well controlled, unsure about lipids, labs were done in hospital but we do not have those records.  Review of Systems  Out of a complete 14 system review, the patient complains of only the following symptoms, and all other reviewed systems are negative.  Neurological: Memory loss (little)  Slurred Speech (little), Hematology: easy bruising Eyes: loss of vision, Constitutional: fatigue Psychiatric: decreased energy   ALLERGIES: Allergies  Allergen Reactions  . Amoxicillin Other (See Comments)    unknown  . Angiotensin Receptor Blockers Other (See Comments)    hypotension  . Ciprofloxacin   . Codeine   . Codeine Nausea And Vomiting  . Demerol [Meperidine] Nausea And Vomiting  . Lisinopril Cough  . Meperidine Hcl   . Morphine   . Nitrofurantoin   . Sulfonamide Derivatives     HOME MEDICATIONS: Outpatient Prescriptions Prior to Visit  Medication Sig Dispense Refill  . ALPRAZolam (XANAX) 0.25 MG tablet Take 0.25 mg by mouth at bedtime as needed.        Marland Kitchen atorvastatin (LIPITOR) 20 MG tablet Take 1 tablet (20 mg total) by mouth daily at  6 PM.  30 tablet  0  . buPROPion (WELLBUTRIN) 100 MG tablet Take 100 mg by mouth daily.      Marland Kitchen dicyclomine (BENTYL) 10 MG capsule Take 10 mg by mouth daily.      . diphenoxylate-atropine (LOMOTIL) 2.5-0.025 MG per tablet Take 1 tablet by mouth daily.      . furosemide (LASIX) 20 MG tablet Take 40 mg by mouth daily.       Marland Kitchen losartan (COZAAR) 25 MG tablet Take 0.5 tablets (12.5 mg total) by mouth daily.  15 tablet  6  . metoprolol succinate (TOPROL XL) 25 MG 24 hr tablet Take 1 tablet (25 mg total) by mouth daily.  30 tablet  6    . mirtazapine (REMERON) 15 MG tablet Take 15 mg by mouth at bedtime.      . traMADol-acetaminophen (ULTRACET) 37.5-325 MG per tablet Take 1 tablet by mouth every 6 (six) hours as needed.        . warfarin (COUMADIN) 5 MG tablet Take 2.5-5 mg by mouth. Takes 5 mg on Monday, Wednesday , & Fridays and 2.5 mg all the other days of the week.       No facility-administered medications prior to visit.    PAST MEDICAL HISTORY: Past Medical History  Diagnosis Date  . Systolic CHF     WITH MODERATE SYSTOLIC DYSFUNCTION. EF 35-45%  . Chronic atrial fibrillation   . Chronic anticoagulation   . GERD (gastroesophageal reflux disease)   . Depression   . PVCs (premature ventricular contractions)   . TIA (transient ischemic attack)   . Dysrhythmia   . Ejection fraction < 50%     EF 35-45% in the past  //  EF 25-30%, echo, October, 2013  //   EF  30%, echo, June 22, 2012, septal dyssynergy, akinetic inferolateral wall,  moderate dilatation of the left ventricle, diffuse hypokinesis,  . Mitral regurgitation     Moderate, echo, January, 2014  . Right ventricular dysfunction     Moderate, echo, June 22, 2012  . Lung nodules     October, 2013, Dr. Marchelle Gearing  . Hypotension     Relative hypotension, office, October, 2013    PAST SURGICAL HISTORY: Past Surgical History  Procedure Laterality Date  . Knee surgery      LEFT KNEE  . Intraocular lens implant, secondary      bilateral  . Abdominal hysterectomy    . Appendectomy      FAMILY HISTORY: Family History  Problem Relation Age of Onset  . Colon cancer Father   . Heart attack Brother     SOCIAL HISTORY: History   Social History  . Marital Status: Married    Spouse Name: N/A    Number of Children: 2  . Years of Education: N/A   Occupational History  . retired    Social History Main Topics  . Smoking status: Former Smoker -- 1.00 packs/day for 18 years    Types: Cigarettes    Quit date: 11/20/1965  . Smokeless tobacco:  Never Used  . Alcohol Use: No  . Drug Use: No  . Sexual Activity: Not on file   Other Topics Concern  . Not on file   Social History Narrative   ** Merged History Encounter **         PHYSICAL EXAM  Filed Vitals:   02/07/13 1429  BP: 115/69  Pulse: 60  Temp: 97.6 F (36.4 C)  TempSrc: Oral  Height: 5\' 7"  (  1.702 m)  Weight: 135 lb (61.236 kg)   Body mass index is 21.14 kg/(m^2).  Physical Exam  General: Pleasant  eldderly Caucasian lady, in no distress.  Afebrile.   Head: nontraumatic Ears, Nose and Throat: Hearing is  diminished Neck: supple with no carotid bruits Respiratory: clear to auscultation Cardiovascular: no murmur or gallop Musculoskeletal:  frail. Mild kyphosis Skin:  no rash  Neurologic Exam  Mental Status: Awake, alert and oriented to time, place and person.  Speech and language appear normal.   Cranial Nerves: Eye movements are full range without nystagmus.  Fundi  not visualized. Visual fields show left peripheral vision cut.  Face is symmetric without weakness.  Tongue is midline. Hearing is  diminished bilaterally. Motor: reveals no upper or lower extremity drift.  Symmetric and equal strength in all four extremities.  No focal weakness. Sensory: Touch and pinprick sensations are normal.   Coordination: finger nose finger and heel shin normal bilaterally Gait and Station: steady gait including tandem walking Reflexes: Deep tendon reflexes are 2+ symmetric.     DIAGNOSTIC DATA (LABS, IMAGING, TESTING) - I reviewed patient records, labs, notes, testing and imaging myself where available.  CT of the brain 01/24/2012  Focal hypoattenuation in the right basal ganglia may represent subacute or chronic non hemorrhagic lacunar type infarct.  Atrophy and nonspecific white matter changes.  MRI of the brain 01/25/2012 1. Acute scattered left MCA infarcts, largest area of involvement in the posterior left MCA territory. No mass effect or hemorrhage. Superimposed  punctate acute or subacute posterior right MCA lacunar infarct.  MRA of the brain 01/25/2012 Anterior circulation atherosclerosis, but no definite stenosis or major branch occlusion correspond to the acute left MCA infarcts. 2. Right ACA irregularity and decreased flow probably corresponds to a combination of atherosclerosis and congenital anatomic variation. 3. Essentially negative posterior circulation.  2D Echocardiogram EF 40-45% with no source of embolus. There is akinesis of the distal inferoseptal anteroseptal and apical myocardium.  Carotid Doppler 06/30/12 No extracranial carotid artery stenosis bilaterally. Vertebrals with antegrade flow bilaterally.   CT head 01/01/13 Large right occipital acute infarct not seen on previous scan. MRI brain 01/18/13 scattered old left MCA infarcts, largest area of involvement in the posterior left MCA territory. No mass effect or hemorrhage. Subacute right PCA infarct.   ASSESSMENT AND PLAN 79 year old Caucasian female here for follow up bilateral MCA infarct 01/24/2012 likely due to atrial fibrillation inspite of therapeutic warfarin INR 2.2.  New recent stroke on 01/01/13, admitted to [facility].  Vascular risk factor of atrial fibrillation, hyperlipidemia, previous strokes and age.   Residual deficit of left homonymous hemianopsia.  Plan:  Continue Coumadin therapy goal INR 2-3.  Recommend changing to newer anticoagulant, asked patient to price check with pharmacy Xarelto, Pradaxa, and Eliquis to see if they can afford one of them.  Asked to discuss change with PCP and Dr. Myrtis Ser, since patient has had 2 strokes on Coumadin.  Maintain control of hyperlidemia with goal LDL's below 100mg %.   Return for followup with nurse practitioner in 2 months.  Isha Seefeld NP-C 02/07/2013, 2:36 PM  [facility] [address] [place], [place] [address] [phone]  I have personally examined this patient, reviewed pertinent data,  developed plan of care and discussed with patient and agree with above.  Delia Heady, MD

## 2013-03-31 ENCOUNTER — Ambulatory Visit: Payer: Self-pay | Admitting: Cardiology

## 2013-04-10 ENCOUNTER — Ambulatory Visit (INDEPENDENT_AMBULATORY_CARE_PROVIDER_SITE_OTHER): Payer: Medicare Other | Admitting: Cardiology

## 2013-04-10 ENCOUNTER — Encounter: Payer: Self-pay | Admitting: Cardiology

## 2013-04-10 VITALS — BP 96/60 | HR 70 | Ht 68.0 in | Wt 132.0 lb

## 2013-04-10 DIAGNOSIS — Z23 Encounter for immunization: Secondary | ICD-10-CM

## 2013-04-10 DIAGNOSIS — I428 Other cardiomyopathies: Secondary | ICD-10-CM

## 2013-04-10 DIAGNOSIS — I429 Cardiomyopathy, unspecified: Secondary | ICD-10-CM

## 2013-04-10 DIAGNOSIS — Z7901 Long term (current) use of anticoagulants: Secondary | ICD-10-CM

## 2013-04-10 DIAGNOSIS — I509 Heart failure, unspecified: Secondary | ICD-10-CM

## 2013-04-10 DIAGNOSIS — I6340 Cerebral infarction due to embolism of unspecified cerebral artery: Secondary | ICD-10-CM

## 2013-04-10 DIAGNOSIS — I4891 Unspecified atrial fibrillation: Secondary | ICD-10-CM

## 2013-04-10 DIAGNOSIS — I959 Hypotension, unspecified: Secondary | ICD-10-CM

## 2013-04-10 DIAGNOSIS — I5020 Unspecified systolic (congestive) heart failure: Secondary | ICD-10-CM

## 2013-04-10 NOTE — Assessment & Plan Note (Signed)
Atrial fibrillation rate is controlled. The patient continues on Coumadin (see the discussion below).

## 2013-04-10 NOTE — Patient Instructions (Signed)
Your physician recommends that you schedule a follow-up appointment in: 6 months. You will receive a reminder letter in the mail in about 4 months reminding you to call and schedule your appointment. If you don't receive this letter, please contact our office. Your physician recommends that you continue on your current medications as directed. Please refer to the Current Medication list given to you today. You have been given a flu shot today.

## 2013-04-10 NOTE — Assessment & Plan Note (Signed)
I have had a lengthy and careful discussion with the patient and her family. They prefer to not changed to a new anticoagulant. They're concerned about the expense. I could not tell them that there was hard evidence that changing to a newer agent would prevent another embolic event. The patient's daughter makes the point that the patient does very poorly when her routine is changed. Considering all these factors she will remain on Coumadin.  As part of today's evaluation I spent greater than 25 minutes with her total care. More than half of this time was with careful discussion with the patient and her daughter about her meds including Coumadin

## 2013-04-10 NOTE — Progress Notes (Signed)
HPI  Patient is seen today to followup atrial fibrillation. I saw her last June, 2014. I carefully reviewed the notes from neurology in the chart. She's had another embolic CVA while on Coumadin. The question has been raised about changing her to a newer anticoagulant because she has had events  on Coumadin. I discussed this with the patient and her family today.  Overall she is feeling well. She does have fatigue.  Allergies  Allergen Reactions  . Amoxicillin Other (See Comments)    unknown  . Angiotensin Receptor Blockers Other (See Comments)    hypotension  . Ciprofloxacin   . Codeine   . Codeine Nausea And Vomiting  . Demerol [Meperidine] Nausea And Vomiting  . Lisinopril Cough  . Meperidine Hcl   . Morphine   . Nitrofurantoin   . Sulfonamide Derivatives     Current Outpatient Prescriptions  Medication Sig Dispense Refill  . ALPRAZolam (XANAX) 0.25 MG tablet Take 0.25 mg by mouth at bedtime as needed.        Marland Kitchen atorvastatin (LIPITOR) 20 MG tablet Take 20 mg by mouth daily at 6 PM.      . buPROPion (WELLBUTRIN) 100 MG tablet Take 100 mg by mouth daily.      Marland Kitchen dicyclomine (BENTYL) 10 MG capsule Take 10 mg by mouth daily.      . diphenoxylate-atropine (LOMOTIL) 2.5-0.025 MG per tablet Take 1 tablet by mouth daily.      . furosemide (LASIX) 20 MG tablet Take 40 mg by mouth daily.       Marland Kitchen losartan (COZAAR) 25 MG tablet Take 0.5 tablets (12.5 mg total) by mouth daily.  15 tablet  6  . metoprolol succinate (TOPROL XL) 25 MG 24 hr tablet Take 1 tablet (25 mg total) by mouth daily.  30 tablet  6  . mirtazapine (REMERON) 15 MG tablet Take 15 mg by mouth at bedtime.      . traMADol-acetaminophen (ULTRACET) 37.5-325 MG per tablet Take 1 tablet by mouth every 6 (six) hours as needed.        . warfarin (COUMADIN) 5 MG tablet Take 2.5-5 mg by mouth. Takes 5 mg on Monday, Wednesday , & Fridays and 2.5 mg all the other days of the week. (MANAGED BY VYAS)       No current  facility-administered medications for this visit.    History   Social History  . Marital Status: Married    Spouse Name: N/A    Number of Children: 2  . Years of Education: N/A   Occupational History  . retired    Social History Main Topics  . Smoking status: Former Smoker -- 1.00 packs/day for 18 years    Types: Cigarettes    Quit date: 11/20/1965  . Smokeless tobacco: Never Used  . Alcohol Use: No  . Drug Use: No  . Sexual Activity: Not on file   Other Topics Concern  . Not on file   Social History Narrative   ** Merged History Encounter **        Family History  Problem Relation Age of Onset  . Colon cancer Father   . Heart attack Brother     Past Medical History  Diagnosis Date  . Systolic CHF     WITH MODERATE SYSTOLIC DYSFUNCTION. EF 35-45%  . Chronic atrial fibrillation   . Chronic anticoagulation   . GERD (gastroesophageal reflux disease)   . Depression   . PVCs (premature ventricular contractions)   .  TIA (transient ischemic attack)   . Dysrhythmia   . Ejection fraction < 50%     EF 35-45% in the past  //  EF 25-30%, echo, October, 2013  //   EF  30%, echo, June 22, 2012, septal dyssynergy, akinetic inferolateral wall,  moderate dilatation of the left ventricle, diffuse hypokinesis,  . Mitral regurgitation     Moderate, echo, January, 2014  . Right ventricular dysfunction     Moderate, echo, June 22, 2012  . Lung nodules     October, 2013, Dr. Marchelle Gearing  . Hypotension     Relative hypotension, office, October, 2013    Past Surgical History  Procedure Laterality Date  . Knee surgery      LEFT KNEE  . Intraocular lens implant, secondary      bilateral  . Abdominal hysterectomy    . Appendectomy      Patient Active Problem List   Diagnosis Date Noted  . Systolic CHF   . Ejection fraction < 50%   . Mitral regurgitation   . Right ventricular dysfunction   . Lung nodules   . Hypotension   . Cardiomyopathy 03/29/2012  . Ventricular  ectopy 03/29/2012  . Weight loss 03/04/2012  . Acute sinusitis 03/04/2012  . 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 Hypokalemia 01/25/2012  . Atrial fibrillation 01/24/2012  . ESOPHAGEAL REFLUX 12/20/2007  . CONGESTIVE HEART FAILURE 11/29/2007  . COUGH 11/29/2007    ROS   Patient denies fever, chills, headache, sweats, rash, change in vision, change in hearing, chest pain, cough, nausea or vomiting, urinary symptoms. All other systems are reviewed and are negative.  PHYSICAL EXAM  Patient is stable. She seems oriented to person time and place. She is here with a family member. She is frail. There is no jugulovenous distention. Lungs are clear. Respiratory effort is nonlabored. Cardiac exam reveals S1 and S2. Abdomen is soft. There is no peripheral edema. There no musculoskeletal deformities. There are no skin rashes.  Filed Vitals:   04/10/13 1102  BP: 96/60  Pulse: 70  Height: 5\' 8"  (1.727 m)  Weight: 132 lb (59.875 kg)  SpO2: 98%   EKG is done today and reviewed by me. There is atrial fibrillation. The rate is controlled. There is old nonspecific interventricular conduction delay. There is old decreasing anterior R wave progression. No significant change from the past.  ASSESSMENT & PLAN

## 2013-04-10 NOTE — Assessment & Plan Note (Signed)
I have reviewed all of the neurology notes. I'm aware that these events have occurred while on therapeutic Coumadin. (See the discussion below about Coumadin)

## 2013-04-10 NOTE — Assessment & Plan Note (Signed)
Volume status is controlled. No change in therapy. 

## 2013-04-10 NOTE — Assessment & Plan Note (Signed)
The patient's blood pressure is low her heart rate is not elevated. I am not able to adjust her medications for this. She is on a very small dose of Cozaar

## 2013-04-10 NOTE — Assessment & Plan Note (Signed)
The patient has relative hypotension. There are no definite symptoms. She does have left ventricular dysfunction. No change in therapy.

## 2013-04-10 NOTE — Assessment & Plan Note (Signed)
Volume status is stable. No change in therapy. 

## 2013-04-11 ENCOUNTER — Other Ambulatory Visit (INDEPENDENT_AMBULATORY_CARE_PROVIDER_SITE_OTHER): Payer: Medicare Other | Admitting: Cardiology

## 2013-04-11 DIAGNOSIS — Z23 Encounter for immunization: Secondary | ICD-10-CM

## 2013-05-09 ENCOUNTER — Ambulatory Visit: Payer: Medicare Other | Admitting: Nurse Practitioner

## 2013-05-09 ENCOUNTER — Other Ambulatory Visit (HOSPITAL_COMMUNITY): Payer: Self-pay | Admitting: Interventional Radiology

## 2013-05-09 ENCOUNTER — Other Ambulatory Visit: Payer: Self-pay | Admitting: Radiology

## 2013-05-09 DIAGNOSIS — M549 Dorsalgia, unspecified: Secondary | ICD-10-CM

## 2013-05-09 DIAGNOSIS — S22089A Unspecified fracture of T11-T12 vertebra, initial encounter for closed fracture: Secondary | ICD-10-CM

## 2013-05-09 DIAGNOSIS — IMO0002 Reserved for concepts with insufficient information to code with codable children: Secondary | ICD-10-CM

## 2013-05-10 ENCOUNTER — Other Ambulatory Visit (HOSPITAL_COMMUNITY): Payer: Self-pay | Admitting: Radiology

## 2013-05-10 ENCOUNTER — Encounter (HOSPITAL_COMMUNITY): Payer: Self-pay | Admitting: Pharmacy Technician

## 2013-05-12 ENCOUNTER — Ambulatory Visit (HOSPITAL_COMMUNITY)
Admission: RE | Admit: 2013-05-12 | Discharge: 2013-05-12 | Disposition: A | Discharge status: Home / Self Care | Payer: Medicare Other | Source: Ambulatory Visit | Attending: Interventional Radiology | Admitting: Interventional Radiology

## 2013-05-12 DIAGNOSIS — S22089A Unspecified fracture of T11-T12 vertebra, initial encounter for closed fracture: Secondary | ICD-10-CM

## 2013-05-12 DIAGNOSIS — X58XXXA Exposure to other specified factors, initial encounter: Secondary | ICD-10-CM | POA: Insufficient documentation

## 2013-05-12 DIAGNOSIS — F329 Major depressive disorder, single episode, unspecified: Secondary | ICD-10-CM | POA: Insufficient documentation

## 2013-05-12 DIAGNOSIS — I509 Heart failure, unspecified: Secondary | ICD-10-CM | POA: Insufficient documentation

## 2013-05-12 DIAGNOSIS — S22009A Unspecified fracture of unspecified thoracic vertebra, initial encounter for closed fracture: Secondary | ICD-10-CM | POA: Insufficient documentation

## 2013-05-12 DIAGNOSIS — I4891 Unspecified atrial fibrillation: Secondary | ICD-10-CM | POA: Insufficient documentation

## 2013-05-12 DIAGNOSIS — F3289 Other specified depressive episodes: Secondary | ICD-10-CM | POA: Insufficient documentation

## 2013-05-12 DIAGNOSIS — K219 Gastro-esophageal reflux disease without esophagitis: Secondary | ICD-10-CM | POA: Insufficient documentation

## 2013-05-12 DIAGNOSIS — Z01812 Encounter for preprocedural laboratory examination: Secondary | ICD-10-CM | POA: Insufficient documentation

## 2013-05-12 DIAGNOSIS — Z87891 Personal history of nicotine dependence: Secondary | ICD-10-CM | POA: Insufficient documentation

## 2013-05-12 DIAGNOSIS — I5020 Unspecified systolic (congestive) heart failure: Secondary | ICD-10-CM | POA: Insufficient documentation

## 2013-05-12 DIAGNOSIS — Z79899 Other long term (current) drug therapy: Secondary | ICD-10-CM | POA: Insufficient documentation

## 2013-05-12 DIAGNOSIS — M549 Dorsalgia, unspecified: Secondary | ICD-10-CM

## 2013-05-12 DIAGNOSIS — I059 Rheumatic mitral valve disease, unspecified: Secondary | ICD-10-CM | POA: Insufficient documentation

## 2013-05-12 LAB — CBC
HCT: 44.5 % (ref 36.0–46.0)
MCHC: 33.7 g/dL (ref 30.0–36.0)
MCV: 85.7 fL (ref 78.0–100.0)
Platelets: 329 10*3/uL (ref 150–400)
RDW: 14.6 % (ref 11.5–15.5)
WBC: 10.4 10*3/uL (ref 4.0–10.5)

## 2013-05-12 LAB — BASIC METABOLIC PANEL
BUN: 23 mg/dL (ref 6–23)
CO2: 30 mEq/L (ref 19–32)
Calcium: 9.5 mg/dL (ref 8.4–10.5)
Chloride: 96 mEq/L (ref 96–112)
Creatinine, Ser: 1.04 mg/dL (ref 0.50–1.10)
GFR calc Af Amer: 56 mL/min — ABNORMAL LOW (ref >90)

## 2013-05-12 LAB — PROTIME-INR: INR: 1.42 (ref 0.00–1.49)

## 2013-05-12 MED ORDER — SODIUM CHLORIDE 0.9 % IV SOLN: INTRAVENOUS | Status: AC

## 2013-05-12 MED ORDER — VANCOMYCIN HCL IN DEXTROSE 1-5 GM/200ML-% IV SOLN
1000.0000 mg | Freq: Once | INTRAVENOUS | Status: AC
  Administered 2013-05-12: 1000 mg via INTRAVENOUS
  Filled 2013-05-12: qty 200

## 2013-05-12 MED ORDER — MIDAZOLAM HCL 2 MG/2ML IJ SOLN
INTRAMUSCULAR | Status: AC | PRN
  Administered 2013-05-12 (×5): 1 mg via INTRAVENOUS

## 2013-05-12 MED ORDER — POTASSIUM CHLORIDE CRYS ER 20 MEQ PO TBCR
20.0000 meq | EXTENDED_RELEASE_TABLET | Freq: Once | ORAL | Status: AC
  Administered 2013-05-12: 20 meq via ORAL
  Filled 2013-05-12: qty 1

## 2013-05-12 MED ORDER — HYDROMORPHONE HCL PF 1 MG/ML IJ SOLN
INTRAMUSCULAR | Status: AC
  Filled 2013-05-12: qty 2

## 2013-05-12 MED ORDER — SODIUM CHLORIDE 0.9 % IV SOLN
Freq: Once | INTRAVENOUS | Status: AC
  Administered 2013-05-12: 08:00:00 via INTRAVENOUS

## 2013-05-12 MED ORDER — FENTANYL CITRATE 0.05 MG/ML IJ SOLN
INTRAMUSCULAR | Status: AC
  Filled 2013-05-12: qty 4

## 2013-05-12 MED ORDER — TOBRAMYCIN SULFATE 1.2 G IJ SOLR
INTRAMUSCULAR | Status: AC
  Filled 2013-05-12: qty 1.2

## 2013-05-12 MED ORDER — MIDAZOLAM HCL 2 MG/2ML IJ SOLN
INTRAMUSCULAR | Status: AC
  Filled 2013-05-12: qty 4

## 2013-05-12 MED ORDER — FENTANYL CITRATE 0.05 MG/ML IJ SOLN
INTRAMUSCULAR | Status: AC | PRN
  Administered 2013-05-12 (×5): 25 ug via INTRAVENOUS

## 2013-05-12 MED ORDER — IOHEXOL 300 MG/ML  SOLN
50.0000 mL | Freq: Once | INTRAMUSCULAR | Status: AC | PRN
  Administered 2013-05-12: 1 mL

## 2013-05-12 MED ORDER — MIDAZOLAM HCL 2 MG/2ML IJ SOLN
INTRAMUSCULAR | Status: AC
  Filled 2013-05-12: qty 6

## 2013-05-12 MED ORDER — FENTANYL CITRATE 0.05 MG/ML IJ SOLN
INTRAMUSCULAR | Status: AC
  Filled 2013-05-12: qty 2

## 2013-05-12 NOTE — Progress Notes (Signed)
PER PAM TURPIN,PA CLIENT MAY RESUME COUMADIN TOMORROW

## 2013-05-12 NOTE — ED Notes (Signed)
Patient rates pain 5 on 1-10 scale and is resting comfortably.

## 2013-05-12 NOTE — ED Notes (Signed)
Pt complaining of low back pain rating 10 on 1-10 scale.

## 2013-05-12 NOTE — Procedures (Signed)
S/P T 11 balloon KP with biopsy 

## 2013-05-12 NOTE — Progress Notes (Signed)
ICE PACK TO BACK

## 2013-05-12 NOTE — ED Notes (Signed)
MD at bedside. 

## 2013-05-12 NOTE — ED Notes (Signed)
Family updated as to patient's status.

## 2013-05-12 NOTE — H&P (Signed)
Casey White is an 79 y.o. female.   Chief Complaint: Severe back pain x 2 weeks "10" on scale of 10 Outside facility imaging- reviewed by Dr Corliss Skains Dr Sherril Croon from [place] has requested Thoracic  11 veterbroplasty/kyphoplasty Scheduled now for same  HPI: Afib- off coumadin 4 days; CHF; GERD; PVCs; TIA  Past Medical History  Diagnosis Date  . Systolic CHF     WITH MODERATE SYSTOLIC DYSFUNCTION. EF 35-45%  . Chronic atrial fibrillation   . Warfarin anticoagulation   . GERD (gastroesophageal reflux disease)   . Depression   . PVCs (premature ventricular contractions)   . TIA (transient ischemic attack)   . Dysrhythmia   . Ejection fraction < 50%     EF 35-45% in the past  //  EF 25-30%, echo, October, 2013  //   EF  30%, echo, June 22, 2012, septal dyssynergy, akinetic inferolateral wall,  moderate dilatation of the left ventricle, diffuse hypokinesis,  . Mitral regurgitation     Moderate, echo, January, 2014  . Right ventricular dysfunction     Moderate, echo, June 22, 2012  . Lung nodules     October, 2013, Dr. Marchelle Gearing  . Hypotension     Relative hypotension, office, October, 2013    Past Surgical History  Procedure Laterality Date  . Knee surgery      LEFT KNEE  . Intraocular lens implant, secondary      bilateral  . Abdominal hysterectomy    . Appendectomy      Family History  Problem Relation Age of Onset  . Colon cancer Father   . Heart attack Brother    Social History:  reports that she quit smoking about 47 years ago. Her smoking use included Cigarettes. She has a 18 pack-year smoking history. She has never used smokeless tobacco. She reports that she does not drink alcohol or use illicit drugs.  Allergies:  Allergies  Allergen Reactions  . Amoxicillin Other (See Comments)    unknown  . Angiotensin Receptor Blockers Other (See Comments)    hypotension  . Ciprofloxacin Other (See Comments)    unknown  . Codeine   . Codeine Nausea And Vomiting  .  Demerol [Meperidine] Nausea And Vomiting  . Lisinopril Cough  . Meperidine Hcl Other (See Comments)    Unknown  . Morphine   . Sulfonamide Derivatives Nausea And Vomiting  . Nitrofurantoin Palpitations    Unknown     (Not in a hospital admission)  No results found for this or any previous visit (from the past 48 hour(s)). No results found.  Review of Systems  Constitutional: Negative for fever.  Respiratory: Negative for shortness of breath.   Cardiovascular: Negative for chest pain.  Gastrointestinal: Negative for nausea, vomiting and abdominal pain.  Musculoskeletal: Positive for back pain.  Neurological: Positive for weakness. Negative for dizziness and headaches.    Blood pressure 153/87, pulse 86, temperature 97.9 F (36.6 C), temperature source Oral, height 5\' 8"  (1.727 m), weight 132 lb (59.875 kg), SpO2 90.00%. Physical Exam  Constitutional: She is oriented to person, place, and time. She appears well-developed.  Thin, frail  Cardiovascular: Normal rate.   No murmur heard. Irreg  Respiratory: Effort normal and breath sounds normal. She has no wheezes.  GI: Soft. Bowel sounds are normal. There is no tenderness.  Musculoskeletal: Normal range of motion.  Painful low back  Neurological: She is alert and oriented to person, place, and time.  Skin: Skin is warm and dry.  Psychiatric: She has a normal mood and affect. Her behavior is normal. Judgment and thought content normal.     Assessment/Plan Severe back pain Acute T11 fx on films Reviewed per Dr Corliss Skains Now scheduled for T11 VP/KP Pt and dtr aware of procedure benefits and risks and agreeable to proceed Consent signed and in chart  Emmani Lesueur A 05/12/2013, 7:53 AM

## 2013-05-15 ENCOUNTER — Other Ambulatory Visit (HOSPITAL_COMMUNITY): Payer: Self-pay | Admitting: Interventional Radiology

## 2013-05-15 DIAGNOSIS — M549 Dorsalgia, unspecified: Secondary | ICD-10-CM

## 2013-05-15 DIAGNOSIS — IMO0002 Reserved for concepts with insufficient information to code with codable children: Secondary | ICD-10-CM

## 2013-05-26 ENCOUNTER — Ambulatory Visit (HOSPITAL_COMMUNITY)
Admission: RE | Admit: 2013-05-26 | Discharge: 2013-05-26 | Disposition: A | Discharge status: Home / Self Care | Payer: Medicare Other | Source: Ambulatory Visit | Attending: Interventional Radiology | Admitting: Interventional Radiology

## 2013-05-26 DIAGNOSIS — M549 Dorsalgia, unspecified: Secondary | ICD-10-CM

## 2013-05-26 DIAGNOSIS — IMO0002 Reserved for concepts with insufficient information to code with codable children: Secondary | ICD-10-CM

## 2013-06-09 ENCOUNTER — Other Ambulatory Visit (HOSPITAL_COMMUNITY): Payer: Self-pay | Admitting: Interventional Radiology

## 2013-06-30 IMAGING — CT CT HEAD W/O CM
1 series · 16 of 30 positions shown, 20 images · non-contrast
Comparison: None.

CLINICAL DATA: Weakness, confusion

CT HEAD WITHOUT CONTRAST
TECHNIQUE: Contiguous axial images were obtained from the base of
the skull through the vertex without contrast.

[Series 2: head routine 4.8 h37s · axial · 0.43mm/px · z∈[-117,+13]mm · 16 of 30 slices shown, 20 images]
[im 2/30  brain]
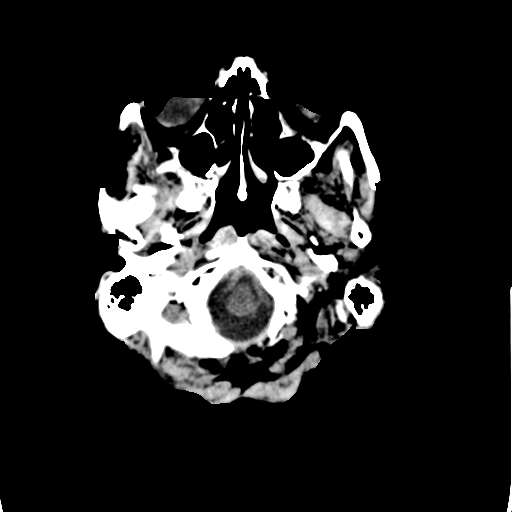
[im 2/30  bone]
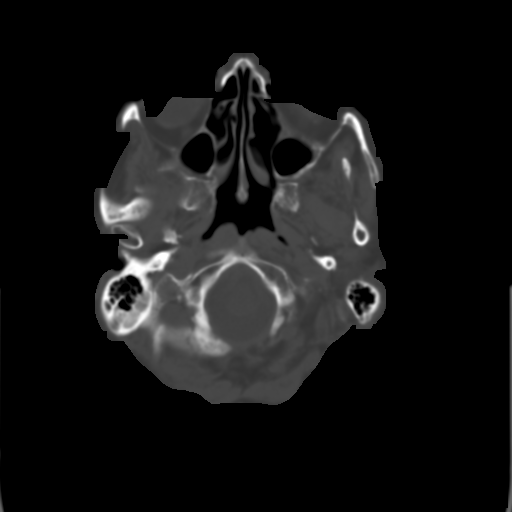
[im 4/30  brain]
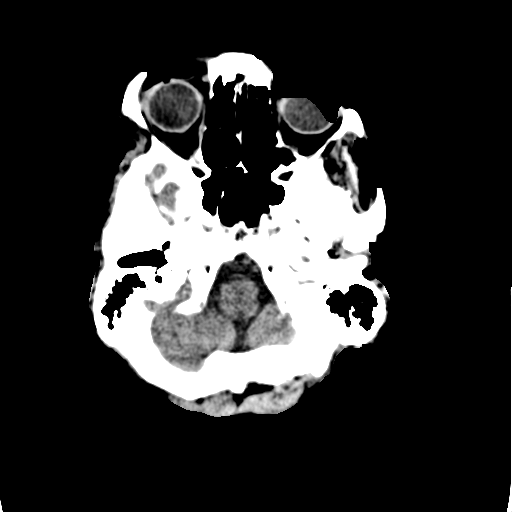
[im 6/30  brain]
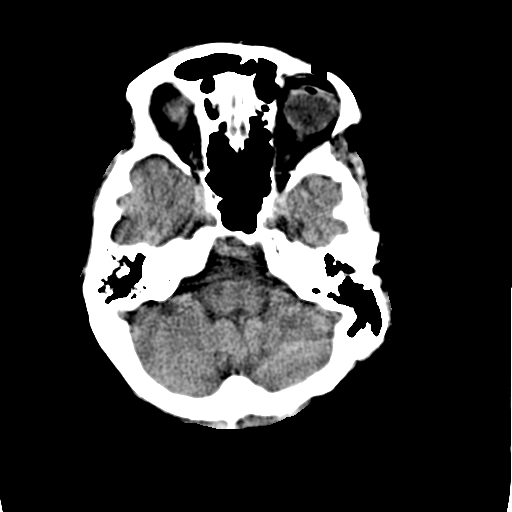
[im 8/30  brain]
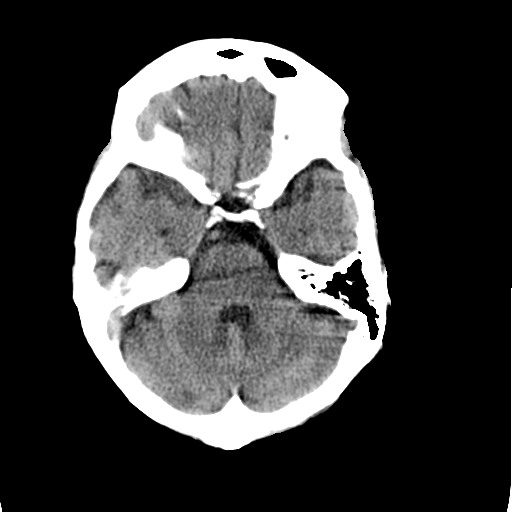
[im 9/30  brain]
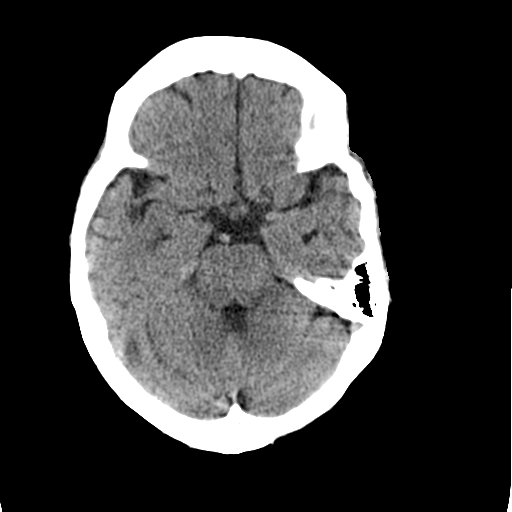
[im 9/30  bone]
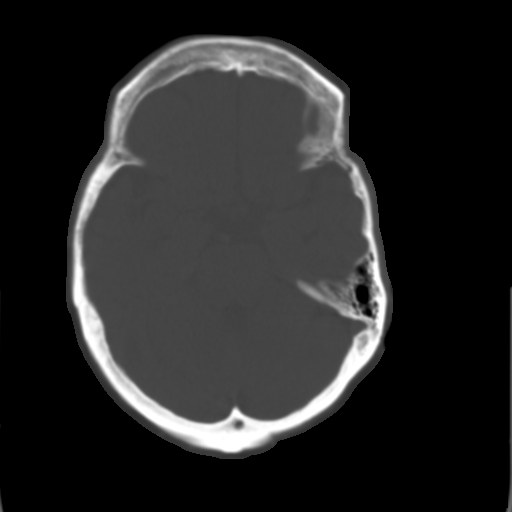
[im 11/30  brain]
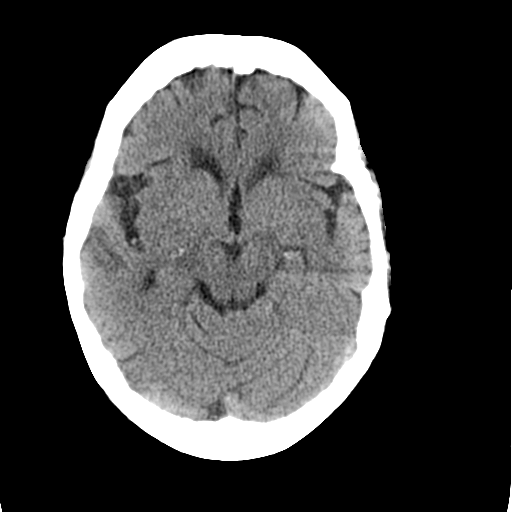
[im 13/30  brain]
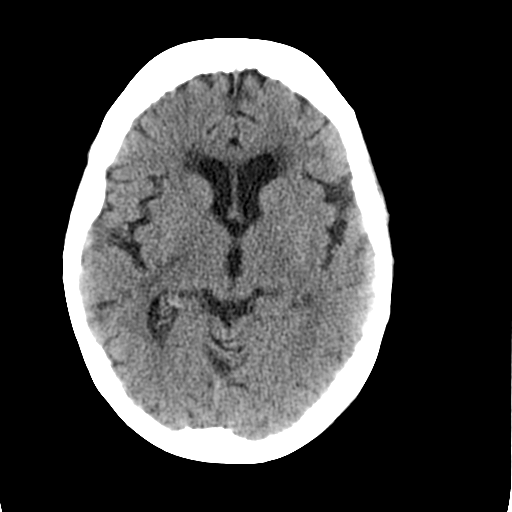
[im 15/30  brain]
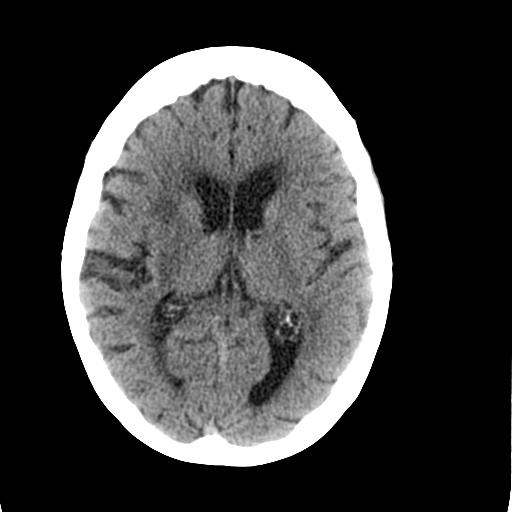
[im 16/30  brain]
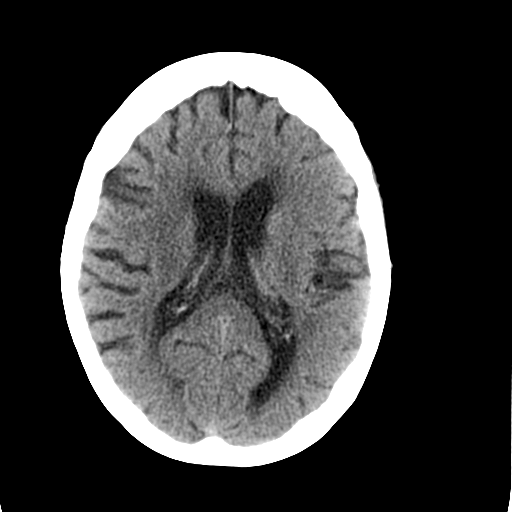
[im 16/30  bone]
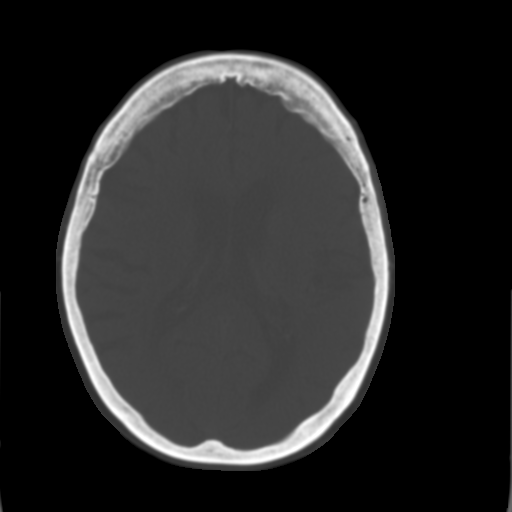
[im 18/30  brain]
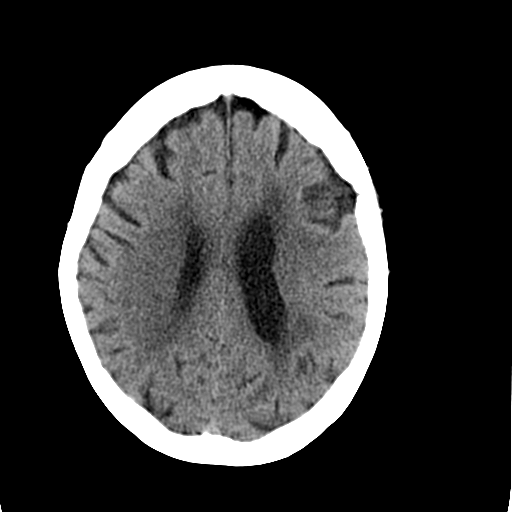
[im 20/30  brain]
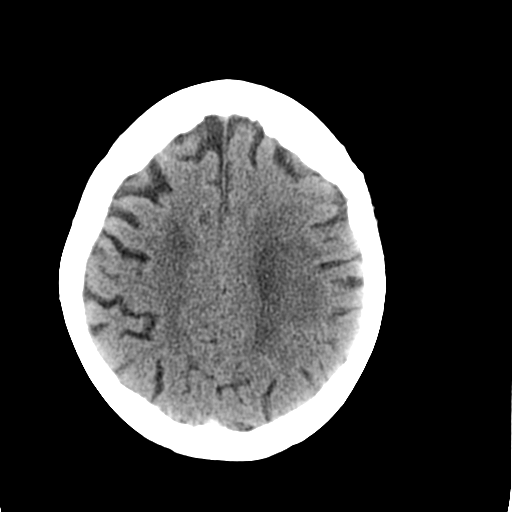
[im 22/30  brain]
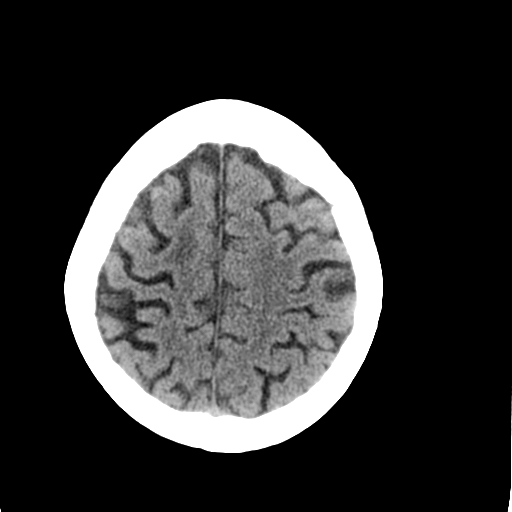
[im 23/30  brain]
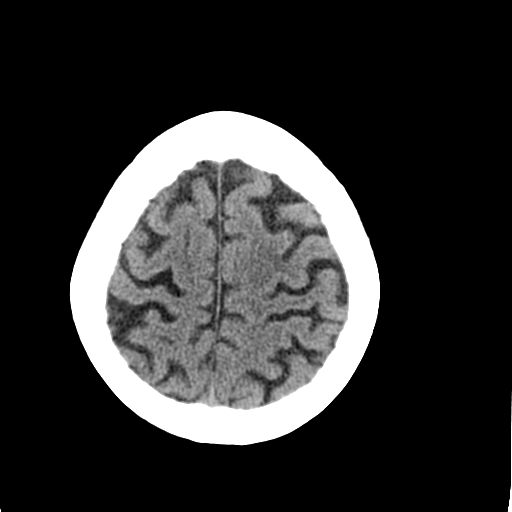
[im 23/30  bone]
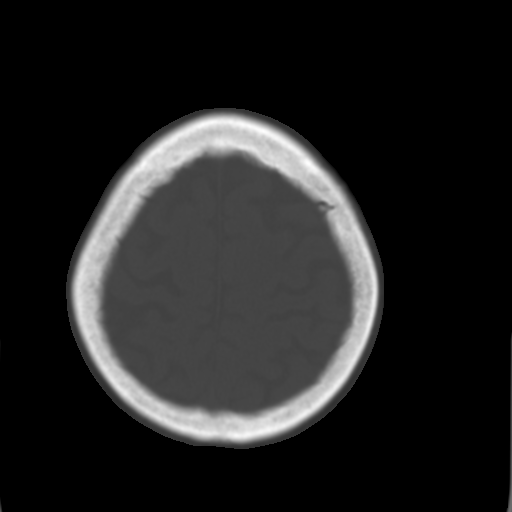
[im 25/30  brain]
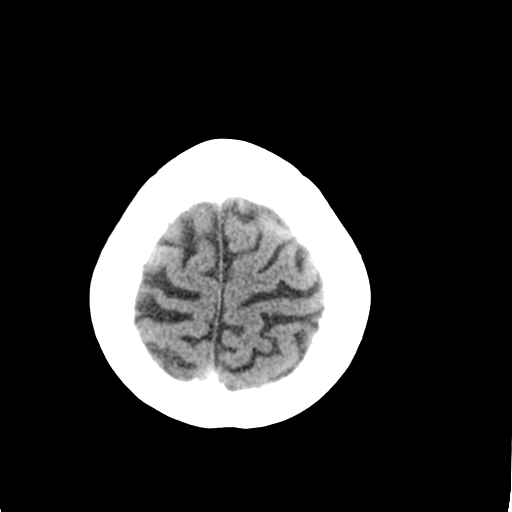
[im 27/30  brain]
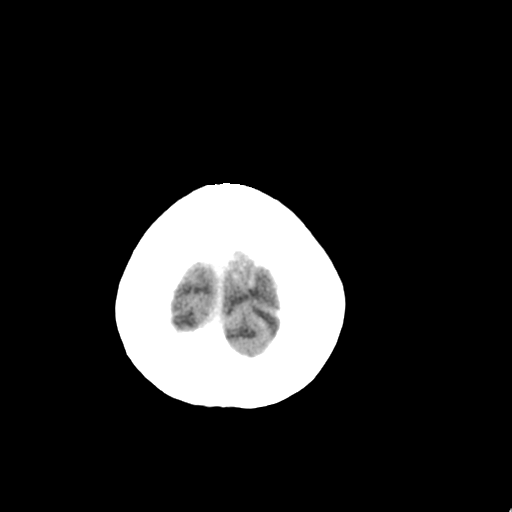
[im 29/30  brain]
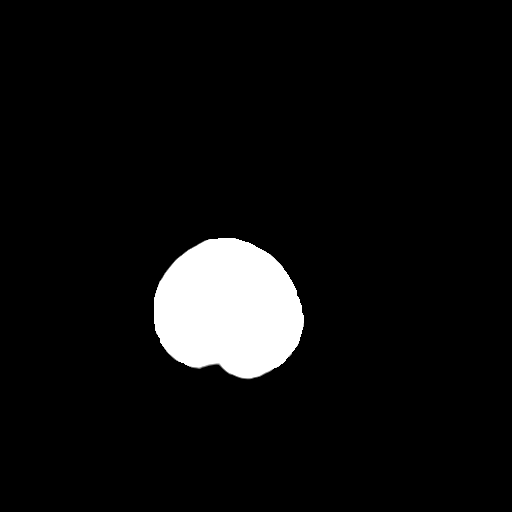

[16 of 30 positions shown; findings below may reference images not displayed]

FINDINGS: Focal hypoattenuation in the right basal ganglia may
represent subacute infarct. Diffuse parenchymal atrophy. Patchy
areas of hypoattenuation in deep and periventricular white matter
bilaterally. Negative for acute intracranial hemorrhage, mass
lesion, acute infarction, midline shift, or mass-effect. Acute
infarct may be inapparent on noncontrast CT. Ventricles and sulci
symmetric. Bone windows demonstrate no focal lesion.
IMPRESSION: 1.  Focal hypoattenuation in the right basal ganglia may represent
subacute or chronic non hemorrhagic lacunar type infarct. I
telephoned the critical test results to Dr. ALUDE at the time of
interpretation.

2. Atrophy and nonspecific white matter changes.

## 2013-07-01 IMAGING — CR DG CHEST 2V
2 series · 2 of 2 positions shown · non-contrast
Comparison: None.

CLINICAL DATA: Stroke

CHEST - 2 VIEW

[w chest pa]
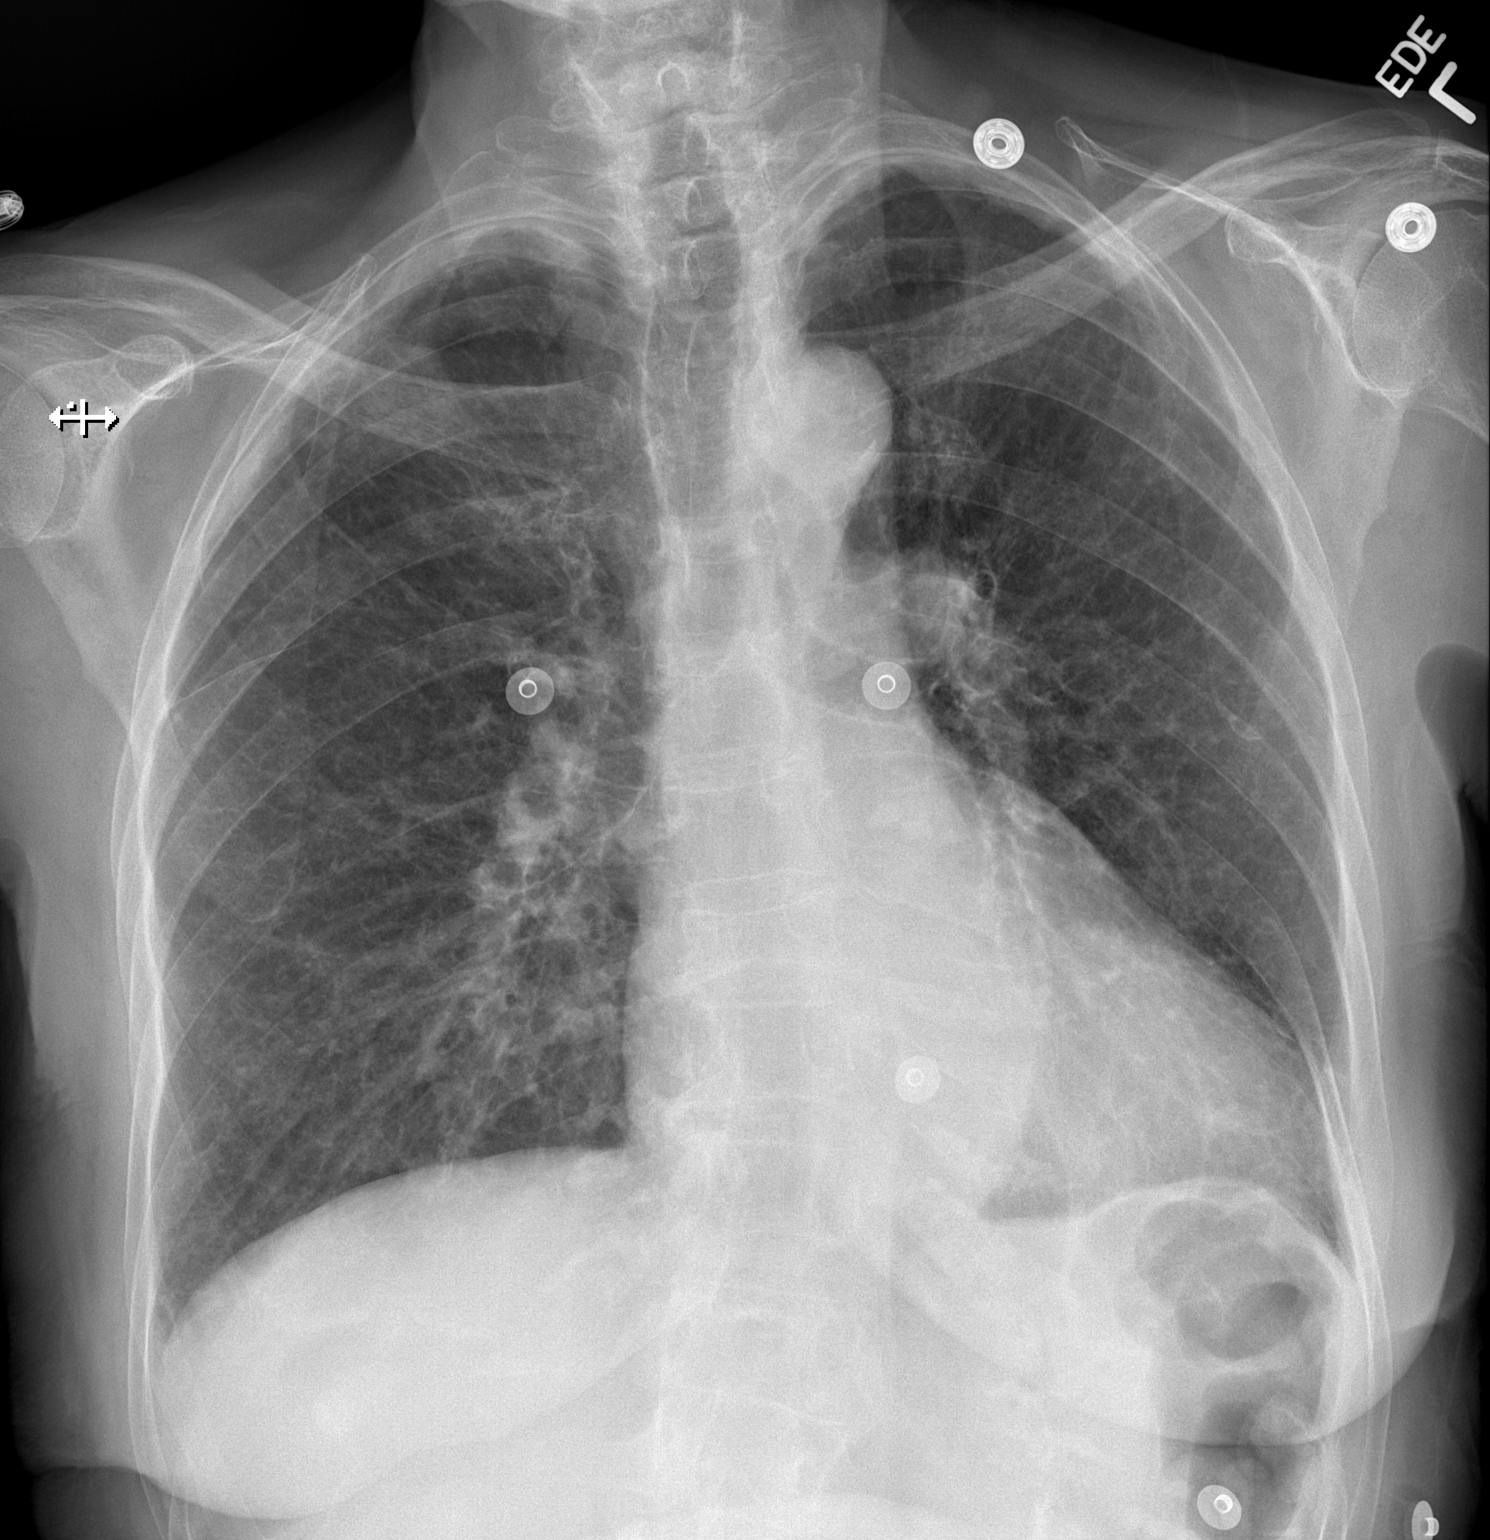

[w chest lat]
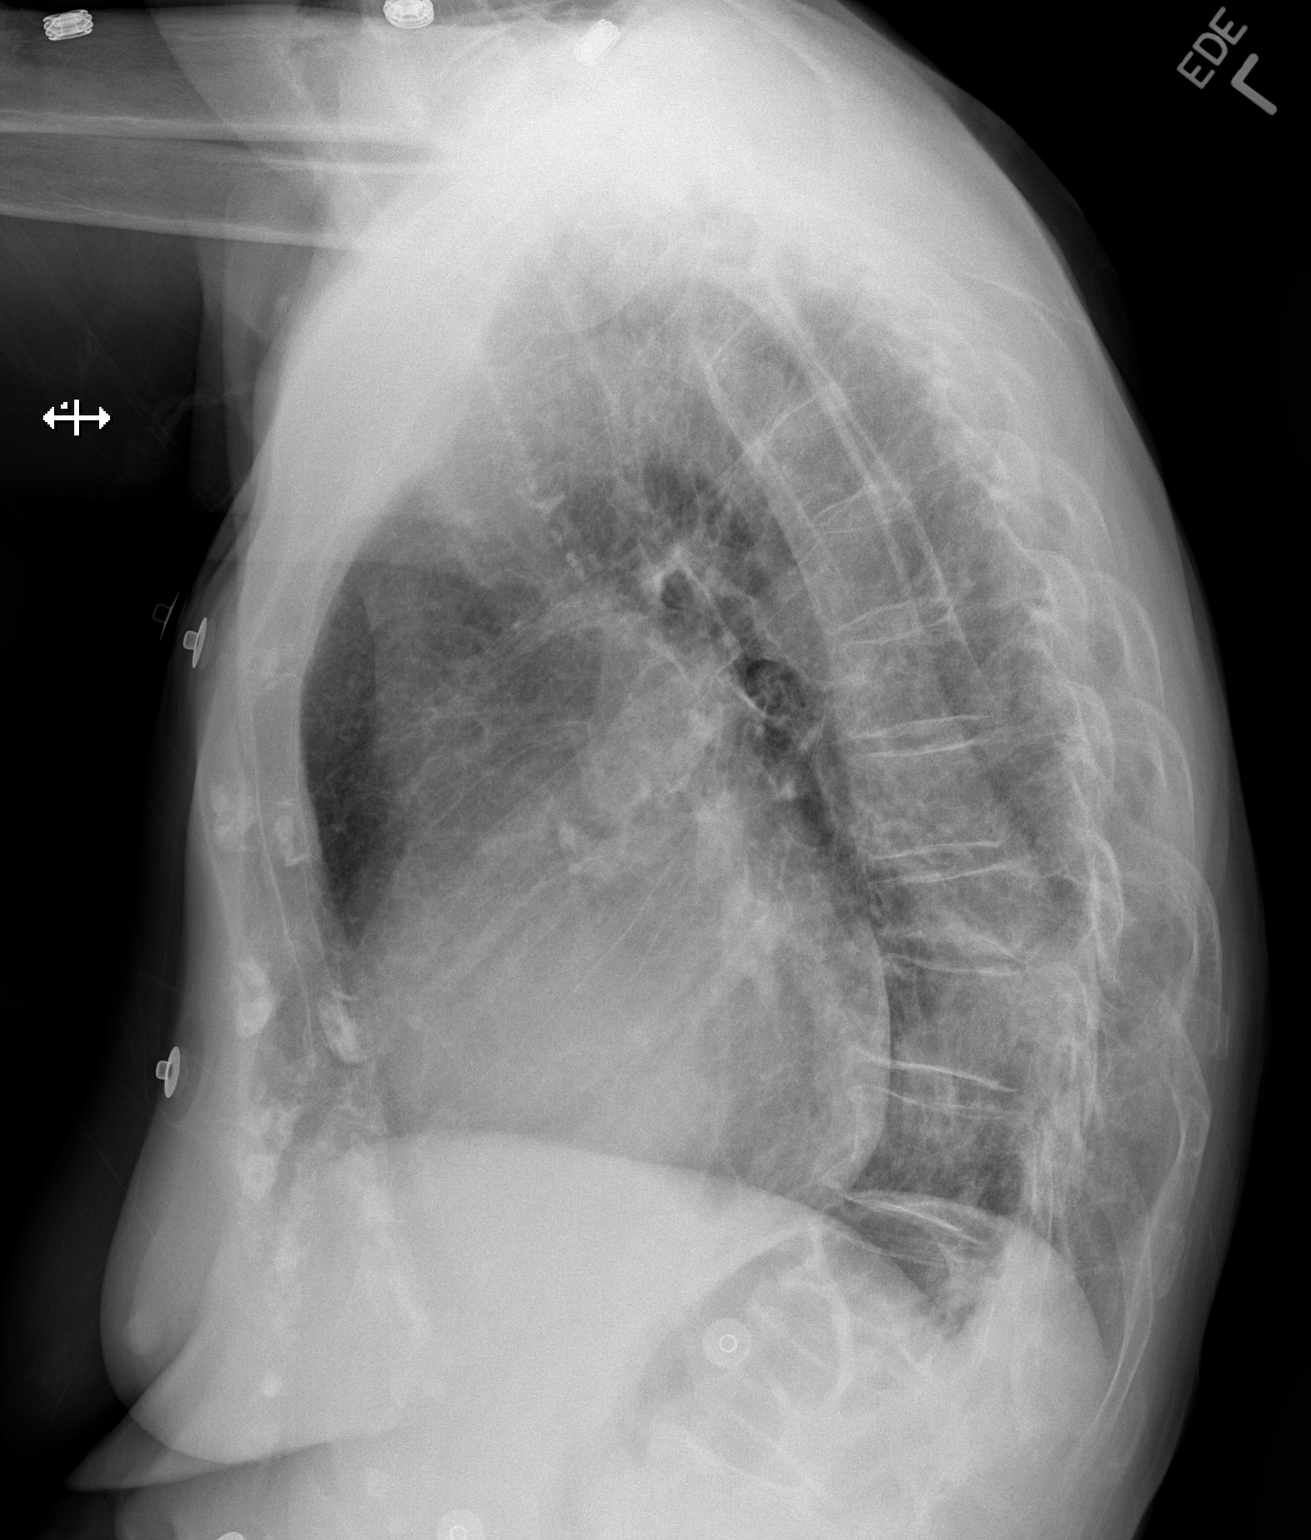

[2 of 2 positions shown; findings below may reference images not displayed]

FINDINGS: Ill-defined density is present at the right apex.
Diffuse interstitial prominence and bronchitic changes.  Mild
cardiomegaly.  No pneumothorax or pleural effusion.

Compression fractures of T9 and T12 of indeterminate age are
present.
IMPRESSION: Ill-defined right upper lobe opacity.  Comparison with prior
studies is recommended.   If none are available, CT can be
performed to exclude mass.

T9 and T12 compression fractures of indeterminate age.

## 2013-07-11 ENCOUNTER — Other Ambulatory Visit: Payer: Self-pay | Admitting: Cardiology

## 2013-07-11 MED ORDER — LOSARTAN POTASSIUM 25 MG PO TABS
12.5000 mg | ORAL_TABLET | Freq: Every day | ORAL | Status: DC
Start: 2013-07-11 — End: 2014-02-01

## 2013-07-11 MED ORDER — METOPROLOL SUCCINATE ER 25 MG PO TB24: 25.0000 mg | ORAL_TABLET | Freq: Every day | ORAL | Status: DC

## 2013-09-01 ENCOUNTER — Other Ambulatory Visit: Payer: Self-pay | Admitting: Neurology

## 2013-09-01 NOTE — Telephone Encounter (Signed)
We were prescribing this in [facility]  

## 2013-09-11 ENCOUNTER — Encounter: Payer: Self-pay | Admitting: Cardiology

## 2013-11-27 ENCOUNTER — Ambulatory Visit: Payer: Self-pay | Admitting: Cardiology

## 2014-02-01 ENCOUNTER — Other Ambulatory Visit: Payer: Self-pay | Admitting: Cardiology

## 2014-02-07 ENCOUNTER — Encounter: Payer: Self-pay | Admitting: Cardiology

## 2014-02-07 ENCOUNTER — Ambulatory Visit (INDEPENDENT_AMBULATORY_CARE_PROVIDER_SITE_OTHER): Payer: Medicare Other | Admitting: Cardiology

## 2014-02-07 VITALS — BP 110/76 | HR 87 | Ht 66.0 in | Wt 130.0 lb

## 2014-02-07 DIAGNOSIS — Z7901 Long term (current) use of anticoagulants: Secondary | ICD-10-CM

## 2014-02-07 DIAGNOSIS — I482 Chronic atrial fibrillation, unspecified: Secondary | ICD-10-CM

## 2014-02-07 DIAGNOSIS — I509 Heart failure, unspecified: Secondary | ICD-10-CM

## 2014-02-07 DIAGNOSIS — I4891 Unspecified atrial fibrillation: Secondary | ICD-10-CM

## 2014-02-07 DIAGNOSIS — I5022 Chronic systolic (congestive) heart failure: Secondary | ICD-10-CM

## 2014-02-07 NOTE — Assessment & Plan Note (Signed)
Atrial fibrillation rate is controlled. She is anticoagulated. No change in therapy. 

## 2014-02-07 NOTE — Assessment & Plan Note (Signed)
She is quite stable and her Coumadin. No change in therapy.

## 2014-02-07 NOTE — Assessment & Plan Note (Signed)
Her volume status is stable. No change in therapy. 

## 2014-02-07 NOTE — Patient Instructions (Signed)
Your physician recommends that you schedule a follow-up appointment in: 9 months. You will receive a reminder letter in the mail in about 7 months reminding you to call and schedule your appointment. If you don't receive this letter, please contact our office. Your physician recommends that you continue on your current medications as directed. Please refer to the Current Medication list given to you today. 

## 2014-02-07 NOTE — Progress Notes (Signed)
Patient ID: Casey White, female   DOB: 04/30/1928, 80 y.o.   MRN: [id number]    HPI  Patient is seen today to followup chronic atrial fibrillation and left ventricular dysfunction. She looks great and she continues to do very well. She's not having any significant chest pain or shortness of breath. She does not feel any significant palpitations.  Allergies  Allergen Reactions  . Amoxicillin Other (See Comments)    unknown  . Angiotensin Receptor Blockers Other (See Comments)    hypotension  . Ciprofloxacin Other (See Comments)    unknown  . Codeine   . Codeine Nausea And Vomiting  . Demerol [Meperidine] Nausea And Vomiting  . Lisinopril Cough  . Meperidine Hcl Other (See Comments)    Unknown  . Morphine   . Sulfonamide Derivatives Nausea And Vomiting  . Nitrofurantoin Palpitations    Unknown    Current Outpatient Prescriptions  Medication Sig Dispense Refill  . ALPRAZolam (XANAX) 0.25 MG tablet Take 0.25 mg by mouth daily as needed for anxiety or sleep.       Marland Kitchen atorvastatin (LIPITOR) 20 MG tablet Take 20 mg by mouth daily at 6 PM.      . buPROPion (WELLBUTRIN SR) 100 MG 12 hr tablet TAKE 1 TABLET BY MOUTH DAILY  90 tablet  3  . FLUoxetine (PROZAC) 20 MG capsule Take 20 mg by mouth daily.      . furosemide (LASIX) 20 MG tablet Take 30 mg by mouth 2 (two) times daily.       Marland Kitchen levothyroxine (SYNTHROID, LEVOTHROID) 25 MCG tablet Take 25 mcg by mouth daily before breakfast.      . losartan (COZAAR) 25 MG tablet TAKE 1/2 TABLET BY MOUTH EVERY DAY  15 tablet  6  . metoprolol succinate (TOPROL-XL) 25 MG 24 hr tablet TAKE 1 TABLET BY MOUTH EVERY DAY  30 tablet  0  . mirtazapine (REMERON) 15 MG tablet Take 15 mg by mouth at bedtime.      Marland Kitchen warfarin (COUMADIN) 5 MG tablet Take 2.5-5 mg by mouth daily. 5 mg on Tuesday, Thursday, and Sunday and 2.5 mg all the other days of the week. (MANAGED BY VYAS)       No current facility-administered medications for this visit.    History    Social History  . Marital Status: Married    Spouse Name: N/A    Number of Children: 2  . Years of Education: N/A   Occupational History  . retired    Social History Main Topics  . Smoking status: Former Smoker -- 1.00 packs/day for 18 years    Types: Cigarettes    Quit date: 11/20/1965  . Smokeless tobacco: Never Used  . Alcohol Use: No  . Drug Use: No  . Sexual Activity: Not on file   Other Topics Concern  . Not on file   Social History Narrative   ** Merged History Encounter **        Family History  Problem Relation Age of Onset  . Colon cancer Father   . Heart attack Brother     Past Medical History  Diagnosis Date  . Systolic CHF     WITH MODERATE SYSTOLIC DYSFUNCTION. EF 35-45%  . Chronic atrial fibrillation   . Warfarin anticoagulation   . GERD (gastroesophageal reflux disease)   . Depression   . PVCs (premature ventricular contractions)   . TIA (transient ischemic attack)   . Dysrhythmia   . Ejection fraction < 50%  EF 35-45% in the past  //  EF 25-30%, echo, October, 2013  //   EF  30%, echo, June 22, 2012, septal dyssynergy, akinetic inferolateral wall,  moderate dilatation of the left ventricle, diffuse hypokinesis,  . Mitral regurgitation     Moderate, echo, January, 2014  . Right ventricular dysfunction     Moderate, echo, June 22, 2012  . Lung nodules     October, 2013, Dr. Marchelle Gearing  . Hypotension     Relative hypotension, office, October, 2013    Past Surgical History  Procedure Laterality Date  . Knee surgery      LEFT KNEE  . Intraocular lens implant, secondary      bilateral  . Abdominal hysterectomy    . Appendectomy      Patient Active Problem List   Diagnosis Date Noted  . Warfarin anticoagulation   . Systolic CHF   . Ejection fraction < 50%   . Mitral regurgitation   . Right ventricular dysfunction   . Lung nodules   . Hypotension   . Cardiomyopathy 03/29/2012  . Ventricular ectopy 03/29/2012  . Weight loss  03/04/2012  . Acute sinusitis 03/04/2012  . 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 Hypokalemia 01/25/2012  . Atrial fibrillation 01/24/2012  . ESOPHAGEAL REFLUX 12/20/2007  . CONGESTIVE HEART FAILURE 11/29/2007  . COUGH 11/29/2007    ROS   Patient denies fever, chills, headache, sweats, rash, change in vision, change in hearing, chest pain, cough, nausea vomiting, urinary symptoms. All other systems are reviewed and are negative.  PHYSICAL EXAM  Patient is here with family. She is quite stable. She is oriented to person time and place. Affect is normal. There is no jugulovenous distention. Head is atraumatic. Sclera and conjunctiva are normal. Lungs are clear. Respiratory effort is nonlabored. Cardiac exam reveals S1 and S2. The rhythm is irregular irregular. Abdomen is soft. There is no peripheral edema. She has kyphosis of the thoracic spine. There no skin rashes.  Filed Vitals:   02/07/14 1306  BP: 110/76  Pulse: 87  Height: 5\' 6"  (1.676 m)  Weight: 130 lb (58.968 kg)     ASSESSMENT & PLAN

## 2014-03-05 ENCOUNTER — Other Ambulatory Visit: Payer: Self-pay | Admitting: Cardiology

## 2014-08-30 ENCOUNTER — Other Ambulatory Visit: Payer: Self-pay | Admitting: Neurology

## 2014-08-30 NOTE — Telephone Encounter (Signed)
Patient has not been seen since 2014.

## 2014-10-02 ENCOUNTER — Other Ambulatory Visit: Payer: Self-pay | Admitting: *Deleted

## 2014-10-02 MED ORDER — METOPROLOL SUCCINATE ER 25 MG PO TB24: 25.0000 mg | ORAL_TABLET | Freq: Every day | ORAL | Status: DC

## 2014-10-02 NOTE — Telephone Encounter (Signed)
Spoke with daughter in reference to request from [facility] for 12.5 mg of toprol xl instead of 25 mg daily. Daughter confirmed that she has been giving the patient toprol xl 12.5 mg daily instead of 25 mg daily. Nurse advised her that profile here is for whole tablet. Daughter advised that her losartan is half tablet daily.

## 2014-10-19 ENCOUNTER — Other Ambulatory Visit: Payer: Self-pay | Admitting: Cardiology

## 2014-10-26 ENCOUNTER — Encounter: Payer: Self-pay | Admitting: Cardiology

## 2014-11-05 ENCOUNTER — Ambulatory Visit (INDEPENDENT_AMBULATORY_CARE_PROVIDER_SITE_OTHER): Payer: Medicare Other | Admitting: Cardiology

## 2014-11-05 ENCOUNTER — Encounter: Payer: Self-pay | Admitting: Cardiology

## 2014-11-05 VITALS — BP 102/68 | HR 46 | Ht 66.0 in | Wt 128.0 lb

## 2014-11-05 DIAGNOSIS — I428 Other cardiomyopathies: Secondary | ICD-10-CM

## 2014-11-05 DIAGNOSIS — Z7901 Long term (current) use of anticoagulants: Secondary | ICD-10-CM

## 2014-11-05 DIAGNOSIS — I482 Chronic atrial fibrillation, unspecified: Secondary | ICD-10-CM

## 2014-11-05 DIAGNOSIS — I5022 Chronic systolic (congestive) heart failure: Secondary | ICD-10-CM | POA: Diagnosis not present

## 2014-11-05 DIAGNOSIS — I429 Cardiomyopathy, unspecified: Secondary | ICD-10-CM

## 2014-11-05 NOTE — Assessment & Plan Note (Signed)
Historically her cardiomyopathy has been felt to be nonischemic. Her last ejection fraction was 30%. She is on the medications that she can tolerate. She has been stable. No further workup.

## 2014-11-05 NOTE — Assessment & Plan Note (Signed)
Atrial fibrillation is chronic. She is anticoagulated. The rate is controlled. No further workup.

## 2014-11-05 NOTE — Patient Instructions (Signed)
Continue all current medications. Your physician wants you to follow up in: 9 months.  You will receive a reminder letter in the mail one-two months in advance.  If you don't receive a letter, please call our office to schedule the follow up appointment   

## 2014-11-05 NOTE — Progress Notes (Signed)
Cardiology Office Note   Date:  11/05/2014   ID:  Casey White, DOB 16-Sep-1927, MRN [id number]  PCP:  Ignatius Specking., MD  Cardiologist:  Willa Rough, MD   Chief Complaint  Patient presents with  . Appointment    Follow-up atrial fibrillation and left ventricular dysfunction.      History of Present Illness: Casey White is a 81 y.o. female who presents today to follow-up chronic atrial fibrillation and left ventricular dysfunction. I saw her last August, 2015. She is 81 years of age. She is in a wheelchair and appears frail but she is stable. She is not having any significant chest pain or shortness of breath.    Past Medical History  Diagnosis Date  . Systolic CHF     WITH MODERATE SYSTOLIC DYSFUNCTION. EF 35-45%  . Chronic atrial fibrillation   . Warfarin anticoagulation   . GERD (gastroesophageal reflux disease)   . Depression   . PVCs (premature ventricular contractions)   . TIA (transient ischemic attack)   . Dysrhythmia   . Ejection fraction < 50%     EF 35-45% in the past  //  EF 25-30%, echo, October, 2013  //   EF  30%, echo, June 22, 2012, septal dyssynergy, akinetic inferolateral wall,  moderate dilatation of the left ventricle, diffuse hypokinesis,  . Mitral regurgitation     Moderate, echo, January, 2014  . Right ventricular dysfunction     Moderate, echo, June 22, 2012  . Lung nodules     October, 2013, Dr. Marchelle Gearing  . Hypotension     Relative hypotension, office, October, 2013    Past Surgical History  Procedure Laterality Date  . Knee surgery      LEFT KNEE  . Intraocular lens implant, secondary      bilateral  . Abdominal hysterectomy    . Appendectomy      Patient Active Problem List   Diagnosis Date Noted  . Chronic systolic CHF (congestive heart failure) 02/07/2014  . Warfarin anticoagulation   . Systolic CHF   . Ejection fraction < 50%   . Mitral regurgitation   . Right ventricular dysfunction   . Lung nodules   .  Hypotension   . Cardiomyopathy 03/29/2012  . Ventricular ectopy 03/29/2012  . Weight loss 03/04/2012  . Acute sinusitis 03/04/2012  . 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 Hypokalemia 01/25/2012  . Atrial fibrillation 01/24/2012  . ESOPHAGEAL REFLUX 12/20/2007  . CONGESTIVE HEART FAILURE 11/29/2007  . COUGH 11/29/2007      Current Outpatient Prescriptions  Medication Sig Dispense Refill  . ALPRAZolam (XANAX) 0.25 MG tablet Take 0.25 mg by mouth daily as needed for anxiety or sleep.     Marland Kitchen atorvastatin (LIPITOR) 20 MG tablet Take 20 mg by mouth daily at 6 PM.    . buPROPion (WELLBUTRIN SR) 100 MG 12 hr tablet TAKE 1 TABLET BY MOUTH DAILY 90 tablet 3  . FLUoxetine (PROZAC) 20 MG capsule Take 20 mg by mouth daily.    . furosemide (LASIX) 20 MG tablet Take 30 mg by mouth 2 (two) times daily.     Marland Kitchen levothyroxine (SYNTHROID, LEVOTHROID) 25 MCG tablet Take 25 mcg by mouth daily before breakfast.    . losartan (COZAAR) 25 MG tablet TAKE 1/2 TABLET BY MOUTH EVERY DAY 15 tablet 6  . metoprolol succinate (TOPROL-XL) 25 MG 24 hr tablet Take 1 tablet (25 mg total) by mouth daily. 30 tablet 6  . mirtazapine (REMERON) 15 MG  tablet Take 15 mg by mouth at bedtime.    Marland Kitchen warfarin (COUMADIN) 5 MG tablet Take 2.5-5 mg by mouth daily. 5 mg on Tuesday, Thursday, and Sunday and 2.5 mg all the other days of the week. (MANAGED BY VYAS)     No current facility-administered medications for this visit.    Allergies:   Amoxicillin; Angiotensin receptor blockers; Ciprofloxacin; Codeine; Codeine; Demerol; Lisinopril; Meperidine hcl; Morphine; Sulfonamide derivatives; and Nitrofurantoin    Social History:  The patient  reports that she quit smoking about 48 years ago. Her smoking use included Cigarettes. She has a 18 pack-year smoking history. She has never used smokeless tobacco. She reports that she does not drink alcohol or use illicit drugs.   Family History:  The patient's family history includes Colon  cancer in her father; Heart attack in her brother.    ROS:  Please see the history of present illness.   Patient denies fever, chills, headache, sweats, rash, change in vision, change in hearing, chest pain, cough, nausea or vomiting, urinary symptoms. All other systems are reviewed and are negative.     PHYSICAL EXAM: VS:  BP 102/68 mmHg  Pulse 46  Ht  (1.676 m)  Wt 128 lb (58.06 kg)  BMI 20.67 kg/m2  SpO2 97% , Patient is here with a family member. She is frail and in a wheelchair but stable. She is oriented to person time and place. Affect is normal. Head is atraumatic. Sclera and conjunctiva are normal. There is no jugular venous distention. Lungs are clear. Respiratory effort is nonlabored. Cardiac exam reveals S1 and S2. The abdomen is soft. There is no peripheral edema. She has significant kyphosis of the thoracic and cervical spine. Neurologic is grossly intact. There are no skin rashes.  EKG:   EKG is done today and reviewed by me. There is atrial fibrillation with a controlled rate. There is an old interventricular conduction delay that is nonspecific. There is poor anterior R-wave progression. There is no significant change from the past.  Recent Labs: No results found for requested labs within last 365 days.    Lipid Panel    Component Value Date/Time   CHOL 173 01/25/2012 0450   TRIG 105 01/25/2012 0450   HDL 45 01/25/2012 0450   CHOLHDL 3.8 01/25/2012 0450   VLDL 21 01/25/2012 0450   LDLCALC 107* 01/25/2012 0450      Wt Readings from Last 3 Encounters:  11/05/14 128 lb (58.06 kg)  02/07/14 130 lb (58.968 kg)  05/12/13 132 lb (59.875 kg)      Current medicines are reviewed  The patient understands her medications.     ASSESSMENT AND PLAN:

## 2014-11-05 NOTE — Assessment & Plan Note (Signed)
The patient remains on Coumadin. She is stable with this.

## 2014-11-05 NOTE — Assessment & Plan Note (Signed)
Her volume status is stable. No change therapy.

## 2015-05-17 ENCOUNTER — Other Ambulatory Visit: Payer: Self-pay | Admitting: Cardiology

## 2015-05-20 ENCOUNTER — Other Ambulatory Visit: Payer: Self-pay | Admitting: Cardiology

## 2015-07-22 ENCOUNTER — Ambulatory Visit (INDEPENDENT_AMBULATORY_CARE_PROVIDER_SITE_OTHER): Payer: Medicare Other | Admitting: Cardiology

## 2015-07-22 ENCOUNTER — Encounter: Payer: Self-pay | Admitting: Cardiology

## 2015-07-22 VITALS — BP 110/67 | HR 75 | Ht 66.0 in | Wt 129.2 lb

## 2015-07-22 DIAGNOSIS — I482 Chronic atrial fibrillation, unspecified: Secondary | ICD-10-CM

## 2015-07-22 DIAGNOSIS — I428 Other cardiomyopathies: Secondary | ICD-10-CM

## 2015-07-22 DIAGNOSIS — R410 Disorientation, unspecified: Secondary | ICD-10-CM | POA: Diagnosis not present

## 2015-07-22 DIAGNOSIS — I340 Nonrheumatic mitral (valve) insufficiency: Secondary | ICD-10-CM

## 2015-07-22 DIAGNOSIS — I429 Cardiomyopathy, unspecified: Secondary | ICD-10-CM | POA: Diagnosis not present

## 2015-07-22 NOTE — Progress Notes (Signed)
Cardiology Office Note  Date: 07/22/2015   ID: Casey White, DOB 02/05/1928, MRN [id number]  PCP: Ignatius Specking., MD  Primary Cardiologist: Nona Dell, MD   Chief Complaint  Patient presents with  . Atrial Fibrillation  . Cardiomyopathy    History of Present Illness: Casey White is an 82 y.o. female former patient of Dr. Myrtis Ser, now establishing follow-up with me. This is our first meeting in the office, I reviewed her records and updated the chart. She last saw Dr. Myrtis Ser in May 2016.   She comes in today with her daughter. Daughter indicates that Casey White has had episodes of confusion and agitation in the last month or so. No definite trigger for this. She lives in her own home and does basic ADLs, her daughter lives next door and checks on her frequently. Today she is oriented and has no specific complaints. Daughter was concerned that she might be having TIAs, we also discussed the possibility of dementia.  She is been managed conservatively with suspected nonischemic cardiomyopathy and chronic atrial fibrillation. I reviewed her medications which are outlined below.she is on Coumadin for stroke prophylaxis. Additional cardiac regimen includes Toprol-XL, Cozaar, Lasix, and Lipitor. Blood pressure and heart rate are well controlled today.  I reviewed her most recent echocardiogram from 2014 which revealed an LVEF of 30% with diffuse hypokinesis, moderate mitral regurgitation, moderate left atrial enlargement.   Past Medical History  Diagnosis Date  . Cardiomyopathy (HCC)     LVEF 30%  . Chronic atrial fibrillation (HCC)   . GERD (gastroesophageal reflux disease)   . Depression   . PVCs (premature ventricular contractions)   . TIA (transient ischemic attack)   . Mitral regurgitation     Moderate, January 2014  . Right ventricular dysfunction     Moderate, January 2014  . Lung nodules     Prior workup by Dr. Marchelle Gearing    Past Surgical History  Procedure Laterality Date   . Knee surgery Left   . Intraocular lens implant, secondary Bilateral   . Abdominal hysterectomy    . Appendectomy      Current Outpatient Prescriptions  Medication Sig Dispense Refill  . ALPRAZolam (XANAX) 0.25 MG tablet Take 0.25 mg by mouth daily as needed for anxiety or sleep.     Marland Kitchen atorvastatin (LIPITOR) 20 MG tablet Take 20 mg by mouth daily at 6 PM.    . buPROPion (WELLBUTRIN SR) 100 MG 12 hr tablet TAKE 1 TABLET BY MOUTH DAILY 90 tablet 3  . FLUoxetine (PROZAC) 20 MG capsule Take 20 mg by mouth daily.    . furosemide (LASIX) 20 MG tablet Take 30 mg by mouth 2 (two) times daily.     Marland Kitchen levothyroxine (SYNTHROID, LEVOTHROID) 25 MCG tablet Take 25 mcg by mouth daily before breakfast.    . losartan (COZAAR) 25 MG tablet TAKE 1/2 TABLET BY MOUTH EVERY DAY 15 tablet 6  . metoprolol succinate (TOPROL-XL) 25 MG 24 hr tablet Take 1 tablet (25 mg total) by mouth daily. 30 tablet 6  . mirtazapine (REMERON) 15 MG tablet Take 15 mg by mouth at bedtime.    Marland Kitchen warfarin (COUMADIN) 5 MG tablet Take 2.5-5 mg by mouth daily. 5 mg on Tuesday, Thursday, and Sunday and 2.5 mg all the other days of the week. (MANAGED BY VYAS)     No current facility-administered medications for this visit.   Allergies:  Amoxicillin; Angiotensin receptor blockers; Ciprofloxacin; Codeine; Codeine; Demerol; Lisinopril; Meperidine hcl; Morphine;  Sulfonamide derivatives; and Nitrofurantoin   Social History: The patient  reports that she quit smoking about 49 years ago. Her smoking use included Cigarettes. She has a 18 pack-year smoking history. She has never used smokeless tobacco. She reports that she does not drink alcohol or use illicit drugs.   ROS:  Please see the history of present illness. Otherwise, complete review of systems is positive for intermittent confusion and agitation, decreased hearing. Uses a walker at home. All other systems are reviewed and negative.   Physical Exam: VS:  BP 110/67 mmHg  Pulse 75  Ht   (1.676 m)  Wt 129 lb 3.2 oz (58.605 kg)  BMI 20.86 kg/m2  SpO2 98%, BMI Body mass index is 20.86 kg/(m^2).  Wt Readings from Last 3 Encounters:  07/22/15 129 lb 3.2 oz (58.605 kg)  11/05/14 128 lb (58.06 kg)  02/07/14 130 lb (58.968 kg)    General: Frail elderly woman, comfortable at rest. HEENT: Conjunctiva and lids normal, oropharynx clear. Neck: Supple, no elevated JVP or carotid bruits, no thyromegaly. Lungs: Clear to auscultation, nonlabored breathing at rest. Cardiac: Irregularly irregular, no S3, 2/6 apical systolic murmur, no pericardial rub. Abdomen: Soft, nontender, bowel sounds present, no guarding or rebound. Extremities: No pitting edema, distal pulses 2+. Skin: Warm and dry. Musculoskeletal: Mild kyphosis. Neuropsychiatric: Alert and oriented x3, affect grossly appropriate.  ECG: I reviewed her tracing from 11/05/2014 which showed atrial fibrillation with IVCD, left anterior fascicular block, PVCs versus aberrantly conducted complexes.  Recent Labwork:  March 2015: BUN 19, creatinine 1.0, hemoglobin 12.7, platelets 298  Other Studies Reviewed Today:  Echocardiogram 06/22/2012: Study Conclusions  - Left ventricle: Septal dyssynergy. Akinetic inferolateral wall. The cavity size was moderately dilated. Wall thickness was increased in a pattern of mild LVH. The estimated ejection fraction was 30%. Diffuse hypokinesis. - Aortic valve: Sclerosis without stenosis. No significant regurgitation. - Mitral valve: Mobility of the posterior leaflet was mildly restricted. Moderate regurgitation. - Left atrium: The atrium was moderately dilated. - Right ventricle: The cavity size was moderately dilated. Systolic function was mildly to moderately reduced. - Right atrium: The atrium was mildly to moderately dilated. - Tricuspid valve: Moderate regurgitation.  Assessment and Plan:  1. Chronic atrial fibrillation, symptomatically stable. She continues on  Coumadin and Toprol-XL. No changes made in present regimen.  2. Presumed nonischemic cardiomyopathy with LVEF approximately 30%. This was managed conservatively by Dr. Myrtis Ser. She is on ARB and beta blocker as well as low-dose diuretic. Weight is stable, she has no peripheral edema. No changes made to current regimen.  3. Moderate mitral regurgitation, apical systolic murmur noted on examination. Could consider follow-up echocardiogram, although with plan for conservative management, no clear indication at this time.  4. Patient with intermittent confusion and agitation per discussion with daughter, symptoms worse in the last month. Although neurological events could be considered with prior history of TIA, she may also be developing dementia.  She has a visit to see Dr. Sherril Croon in the next 2 weeks. I asked the patient's daughter to have her evaluated more urgently in the ER if symptoms escalate.  Current medicines were reviewed with the patient today.  Disposition: FU with me in 6 months.   Signed, Jonelle Sidle, MD, [facility] 07/22/2015 11:48 AM    [facility] at [facility] [address] [place], [place], [place] [address] Phone: [phone]; Fax: [phone]

## 2015-07-22 NOTE — Patient Instructions (Signed)
Your physician recommends that you continue on your current medications as directed. Please refer to the Current Medication list given to you today. Your physician recommends that you schedule a follow-up appointment in: 6 months. You will receive a reminder letter in the mail in about 4 months reminding you to call and schedule your appointment. If you don't receive this letter, please contact our office. 

## 2015-08-15 ENCOUNTER — Other Ambulatory Visit: Payer: Self-pay | Admitting: Cardiology

## 2016-01-15 ENCOUNTER — Other Ambulatory Visit: Payer: Self-pay | Admitting: Cardiology

## 2016-01-29 ENCOUNTER — Encounter: Payer: Self-pay | Admitting: *Deleted

## 2016-01-29 NOTE — Progress Notes (Signed)
Cardiology Office Note  Date: 01/30/2016   ID: Casey White, DOB 1928/05/23, MRN [id number]  PCP: Glenda Chroman, MD  Primary Cardiologist: Rozann Lesches, MD   Chief Complaint  Patient presents with  . Atrial Fibrillation  . Cardiomyopathy    History of Present Illness: Casey White is an 82 y.o. female that I met in February, a former patient of Dr. Ron Parker. She is here today for a routine follow-up visit. She remains functionally limited, uses a wheelchair when she gets out, and a rolling walker at home. Denies any recent falls. She does not report any chest pain but has chronic dyspnea on exertion. No palpitations.  She is on Coumadin, followed by Dr. Woody Seller. Reports no bleeding episodes. Otherwise remains on Toprol-XL, Cozaar, and Lasix for management of cardiomyopathy which has been conservative over time.  I reviewed her ECG today which shows atrial fibrillation with intermittent PVCs versus aberrantly conducted complexes, also probable old anterior infarct pattern and left anterior fascicular block.  Past Medical History:  Diagnosis Date  . Cardiomyopathy (HCC)    LVEF 30%  . Chronic atrial fibrillation ([place])   . Depression   . GERD (gastroesophageal reflux disease)   . Lung nodules    Prior workup by Dr. Chase Caller  . Mitral regurgitation    Moderate, January 2014  . PVCs (premature ventricular contractions)   . Right ventricular dysfunction    Moderate, January 2014  . TIA (transient ischemic attack)     Current Outpatient Prescriptions  Medication Sig Dispense Refill  . ALPRAZolam (XANAX) 0.25 MG tablet Take 0.25 mg by mouth daily as needed for anxiety or sleep.     Marland Kitchen atorvastatin (LIPITOR) 20 MG tablet Take 20 mg by mouth daily at 6 PM.    . buPROPion (WELLBUTRIN SR) 100 MG 12 hr tablet TAKE 1 TABLET BY MOUTH DAILY 90 tablet 3  . FLUoxetine (PROZAC) 20 MG capsule Take 20 mg by mouth daily.    . furosemide (LASIX) 20 MG tablet Take 30 mg by mouth 2 (two) times  daily.     Marland Kitchen levothyroxine (SYNTHROID, LEVOTHROID) 25 MCG tablet Take 25 mcg by mouth daily before breakfast.    . losartan (COZAAR) 25 MG tablet TAKE 1/2 TABLET BY MOUTH EVERY DAY 15 tablet 6  . metoprolol succinate (TOPROL-XL) 25 MG 24 hr tablet TAKE 1 TABLET BY MOUTH DAILY 30 tablet 6  . mirtazapine (REMERON) 15 MG tablet Take 15 mg by mouth at bedtime.    Marland Kitchen warfarin (COUMADIN) 5 MG tablet Take 2.5-5 mg by mouth daily. 5 mg on Tuesday, Thursday, and Sunday and 2.5 mg all the other days of the week. (MANAGED BY VY[facility])     No current facility-administered medications for this visit.    Allergies:  Amoxicillin; Angiotensin receptor blockers; Ciprofloxacin; Codeine; Codeine; Demerol [meperidine]; Lisinopril; Meperidine hcl; Morphine; Sulfonamide derivatives; and Nitrofurantoin   Social History: The patient  reports that she quit smoking about 50 years ago. Her smoking use included Cigarettes. She has a 18.00 pack-year smoking history. She has never used smokeless tobacco. She reports that she does not drink alcohol or use drugs.   ROS:  Please see the history of present illness. Otherwise, complete review of systems is positive for decreased hearing.  All other systems are reviewed and negative.   Physical Exam: VS:  BP 118/60   Pulse 77   Ht _0  (1.676 m)   Wt 129 lb 3.2 oz (58.6 kg)  SpO2 97%   BMI 20.85 kg/m , BMI Body mass index is 20.85 kg/m.  Wt Readings from Last 3 Encounters:  01/30/16 129 lb 3.2 oz (58.6 kg)  07/22/15 129 lb 3.2 oz (58.6 kg)  11/05/14 128 lb (58.1 kg)    General: Frail elderly woman, appears comfortable at rest. Seated in wheelchair. HEENT: Conjunctiva and lids normal, oropharynx clear. Neck: Supple, no elevated JVP or carotid bruits, no thyromegaly. Lungs: Clear to auscultation, nonlabored breathing at rest. Cardiac: Irregularly irregular, no S3, 2/6 apical systolic murmur, no pericardial rub. Abdomen: Soft, nontender, bowel sounds present, no guarding  or rebound. Extremities: No pitting edema, distal pulses 2+.  ECG: I personally reviewed the tracing from 11/05/2014 which showed atrial fibrillation with IVCD, left anterior fascicular block, PVCs versus aberrantly conducted complexes.  Recent Labwork: No results found for requested labs within last 8760 hours.     Component Value Date/Time   CHOL 173 01/25/2012 0450   TRIG 105 01/25/2012 0450   HDL 45 01/25/2012 0450   CHOLHDL 3.8 01/25/2012 0450   VLDL 21 01/25/2012 0450   LDLCALC 107 (H) 01/25/2012 0450    Other Studies Reviewed Today:  Echocardiogram 06/22/2012: Study Conclusions  - Left ventricle: Septal dyssynergy. Akinetic inferolateral wall. The cavity size was moderately dilated. Wall thickness was increased in a pattern of mild LVH. The estimated ejection fraction was 30%. Diffuse hypokinesis. - Aortic valve: Sclerosis without stenosis. No significant regurgitation. - Mitral valve: Mobility of the posterior leaflet was mildly restricted. Moderate regurgitation. - Left atrium: The atrium was moderately dilated. - Right ventricle: The cavity size was moderately dilated. Systolic function was mildly to moderately reduced. - Right atrium: The atrium was mildly to moderately dilated. - Tricuspid valve: Moderate regurgitation.  Assessment and Plan:  1. Cardiomyopathy with LVEF 30%, diffuse hypokinesis suggesting potential nonischemic cardiomyopathy. This has not been aggressively evaluated with plan for conservative management. She has been relatively stable on medical therapy. Weight has been stable compared to last visit.  2. Chronic atrial fibrillation on combination of Toprol-XL and Coumadin. Anticoagulation is followed by Dr. Woody Seller. She denies any recent falls or bleeding problems.  3. Moderate mitral regurgitation, also followed conservatively.  Current medicines were reviewed with the patient today.   Orders Placed This Encounter  Procedures  . EKG  12-Lead    Disposition: Follow-up with me in 6 months.  Signed, Satira Sark, MD, [facility] 01/30/2016 11:33 AM    [facility] [place], [place], [place] [address] Phone: [phone]; Fax: [phone]

## 2016-01-30 ENCOUNTER — Ambulatory Visit (INDEPENDENT_AMBULATORY_CARE_PROVIDER_SITE_OTHER): Payer: Medicare Other | Admitting: Cardiology

## 2016-01-30 ENCOUNTER — Encounter: Payer: Self-pay | Admitting: Cardiology

## 2016-01-30 VITALS — BP 118/60 | HR 77 | Ht 66.0 in | Wt 129.2 lb

## 2016-01-30 DIAGNOSIS — I428 Other cardiomyopathies: Secondary | ICD-10-CM

## 2016-01-30 DIAGNOSIS — I340 Nonrheumatic mitral (valve) insufficiency: Secondary | ICD-10-CM

## 2016-01-30 DIAGNOSIS — I429 Cardiomyopathy, unspecified: Secondary | ICD-10-CM | POA: Diagnosis not present

## 2016-01-30 DIAGNOSIS — I4891 Unspecified atrial fibrillation: Secondary | ICD-10-CM

## 2016-01-30 NOTE — Patient Instructions (Signed)

## 2016-04-13 ENCOUNTER — Other Ambulatory Visit: Payer: Self-pay | Admitting: Cardiology

## 2016-07-20 NOTE — Progress Notes (Deleted)
Cardiology Office Note  Date: 07/20/2016   ID: Casey White, DOB 1928-04-26, MRN [id number]  PCP: Ignatius Specking, MD  Primary Cardiologist: Nona Dell, MD   No chief complaint on file.   History of Present Illness: Casey White is an 83 y.o. female last seen in August 2017.  She can use on Coumadin, followed by Dr. Sherril Croon.  Past Medical History:  Diagnosis Date  . Cardiomyopathy (HCC)    LVEF 30%  . Chronic atrial fibrillation (HCC)   . Depression   . GERD (gastroesophageal reflux disease)   . Lung nodules    Prior workup by Dr. Marchelle Gearing  . Mitral regurgitation    Moderate, January 2014  . PVCs (premature ventricular contractions)   . Right ventricular dysfunction    Moderate, January 2014  . TIA (transient ischemic attack)     Past Surgical History:  Procedure Laterality Date  . ABDOMINAL HYSTERECTOMY    . APPENDECTOMY    . INTRAOCULAR LENS IMPLANT, SECONDARY Bilateral   . KNEE SURGERY Left     Current Outpatient Prescriptions  Medication Sig Dispense Refill  . ALPRAZolam (XANAX) 0.25 MG tablet Take 0.25 mg by mouth daily as needed for anxiety or sleep.     Marland Kitchen atorvastatin (LIPITOR) 20 MG tablet Take 20 mg by mouth daily at 6 PM.    . buPROPion (WELLBUTRIN SR) 100 MG 12 hr tablet TAKE 1 TABLET BY MOUTH DAILY 90 tablet 3  . FLUoxetine (PROZAC) 20 MG capsule Take 20 mg by mouth daily.    . furosemide (LASIX) 20 MG tablet Take 30 mg by mouth 2 (two) times daily.     Marland Kitchen levothyroxine (SYNTHROID, LEVOTHROID) 25 MCG tablet Take 25 mcg by mouth daily before breakfast.    . losartan (COZAAR) 25 MG tablet TAKE 1/2 TABLET BY MOUTH EVERY DAY 15 tablet 6  . losartan (COZAAR) 25 MG tablet TAKE 1/2 TABLET BY MOUTH EVERY DAY 45 tablet 3  . metoprolol succinate (TOPROL-XL) 25 MG 24 hr tablet TAKE 1 TABLET BY MOUTH DAILY 30 tablet 6  . mirtazapine (REMERON) 15 MG tablet Take 15 mg by mouth at bedtime.    Marland Kitchen warfarin (COUMADIN) 5 MG tablet Take 2.5-5 mg by mouth daily. 5 mg on  Tuesday, Thursday, and Sunday and 2.5 mg all the other days of the week. (MANAGED BY [facility])     No current facility-administered medications for this visit.    Allergies:  Amoxicillin; Angiotensin receptor blockers; Ciprofloxacin; Codeine; Codeine; Demerol [meperidine]; Lisinopril; Meperidine hcl; Morphine; Sulfonamide derivatives; and Nitrofurantoin   Social History: The patient  reports that she quit smoking about 50 years ago. Her smoking use included Cigarettes. She has a 18.00 pack-year smoking history. She has never used smokeless tobacco. She reports that she does not drink alcohol or use drugs.   Family History: The patient's family history includes Colon cancer in her father; Heart attack in her brother.   ROS:  Please see the history of present illness. Otherwise, complete review of systems is positive for {NONE DEFAULTED:18576::"none"}.  All other systems are reviewed and negative.   Physical Exam: VS:  There were no vitals taken for this visit., BMI There is no height or weight on file to calculate BMI.  Wt Readings from Last 3 Encounters:  01/30/16 129 lb 3.2 oz (58.6 kg)  07/22/15 129 lb 3.2 oz (58.6 kg)  11/05/14 128 lb (58.1 kg)    General: Frail elderly woman, appears comfortable at rest. Seated  in wheelchair. HEENT: Conjunctiva and lids normal, oropharynx clear. Neck: Supple, no elevated JVP or carotid bruits, no thyromegaly. Lungs: Clear to auscultation, nonlabored breathing at rest. Cardiac: Irregularly irregular, no S3, 2/6 apical systolic murmur, no pericardial rub. Abdomen: Soft, nontender, bowel sounds present, no guarding or rebound. Extremities: No pitting edema, distal pulses 2+.  ECG: I personally reviewed the tracing from 8/70/2017 which showed rate-controlled atrial fibrillation with frequent PVCs versus aberrantly conducted complexes, poor progression rule out old anterior infarct pattern, and low voltage.  Recent Labwork:   Other Studies Reviewed  Today:  Echocardiogram 06/22/2012: Study Conclusions  - Left ventricle: Septal dyssynergy. Akinetic inferolateral wall. The cavity size was moderately dilated. Wall thickness was increased in a pattern of mild LVH. The estimated ejection fraction was 30%. Diffuse hypokinesis. - Aortic valve: Sclerosis without stenosis. No significant regurgitation. - Mitral valve: Mobility of the posterior leaflet was mildly restricted. Moderate regurgitation. - Left atrium: The atrium was moderately dilated. - Right ventricle: The cavity size was moderately dilated. Systolic function was mildly to moderately reduced. - Right atrium: The atrium was mildly to moderately dilated. - Tricuspid valve: Moderate regurgitation.  Assessment and Plan:    Current medicines were reviewed with the patient today.  No orders of the defined types were placed in this encounter.   Disposition:  Signed, Jonelle Sidle, MD, [facility] 07/20/2016 2:44 PM    [facility] at [facility] [address] [place], [place], [place] [address] Phone: [phone]; Fax: [phone]

## 2016-07-22 ENCOUNTER — Ambulatory Visit: Payer: Self-pay | Admitting: Cardiology

## 2016-08-10 ENCOUNTER — Other Ambulatory Visit: Payer: Self-pay | Admitting: Cardiology

## 2016-08-24 ENCOUNTER — Ambulatory Visit: Payer: Self-pay | Admitting: Cardiology

## 2016-09-22 ENCOUNTER — Ambulatory Visit (INDEPENDENT_AMBULATORY_CARE_PROVIDER_SITE_OTHER): Payer: Medicare Other | Admitting: Cardiology

## 2016-09-22 ENCOUNTER — Encounter: Payer: Self-pay | Admitting: Cardiology

## 2016-09-22 VITALS — BP 98/58 | HR 51 | Ht 66.0 in | Wt 126.8 lb

## 2016-09-22 DIAGNOSIS — I482 Chronic atrial fibrillation, unspecified: Secondary | ICD-10-CM

## 2016-09-22 DIAGNOSIS — I340 Nonrheumatic mitral (valve) insufficiency: Secondary | ICD-10-CM | POA: Diagnosis not present

## 2016-09-22 DIAGNOSIS — I428 Other cardiomyopathies: Secondary | ICD-10-CM | POA: Diagnosis not present

## 2016-09-22 DIAGNOSIS — N390 Urinary tract infection, site not specified: Secondary | ICD-10-CM

## 2016-09-22 MED ORDER — LOSARTAN POTASSIUM 25 MG PO TABS: 12.5000 mg | ORAL_TABLET | Freq: Every day | ORAL | Status: AC

## 2016-09-22 NOTE — Progress Notes (Signed)
Cardiology Office Note  Date: 09/22/2016   ID: Casey White, DOB Apr 01, 1928, MRN [id number]  PCP: Ignatius Specking, MD  Primary Cardiologist: Nona Dell, MD   Chief Complaint  Patient presents with  . Cardiomyopathy    History of Present Illness: Casey White is an 83 y.o. female last seen in August 2017. She presents with her daughter for a routine follow-up visit. This morning patient was reported to be very weak, had some slurring of her speech. She was seen at [facility] this morning and diagnosed with suspected UTI by report. She has been prescribed antibiotics. Otherwise not report any chest pain or palpitations.  She continues on Coumadin with follow-up per Dr. Sherril Croon. No reported bleeding episodes.  I reviewed her medications which are outlined below. Systolic blood pressure that is in the 90s today, I recommend holding Cozaar for the time being. She has a follow-up check at [facility] next week for PT/INR and can have her blood pressure rechecked at that point.  Past Medical History:  Diagnosis Date  . Cardiomyopathy (HCC)    LVEF 30%  . Chronic atrial fibrillation (HCC)   . Depression   . GERD (gastroesophageal reflux disease)   . Lung nodules    Prior workup by Dr. Marchelle Gearing  . Mitral regurgitation    Moderate, January 2014  . PVCs (premature ventricular contractions)   . Right ventricular dysfunction    Moderate, January 2014  . TIA (transient ischemic attack)     Past Surgical History:  Procedure Laterality Date  . ABDOMINAL HYSTERECTOMY    . APPENDECTOMY    . INTRAOCULAR LENS IMPLANT, SECONDARY Bilateral   . KNEE SURGERY Left     Current Outpatient Prescriptions  Medication Sig Dispense Refill  . ALPRAZolam (XANAX) 0.25 MG tablet Take 0.25 mg by mouth daily as needed for anxiety or sleep.     Marland Kitchen amoxicillin (AMOXIL) 500 MG capsule Take 500 mg by mouth 3 (three) times daily. Beginning today for UTI    . atorvastatin (LIPITOR) 20  MG tablet Take 20 mg by mouth daily at 6 PM.    . buPROPion (WELLBUTRIN SR) 100 MG 12 hr tablet TAKE 1 TABLET BY MOUTH DAILY 90 tablet 3  . FLUoxetine (PROZAC) 20 MG capsule Take 20 mg by mouth daily.    . furosemide (LASIX) 20 MG tablet Take 30 mg by mouth 2 (two) times daily.     Marland Kitchen levothyroxine (SYNTHROID, LEVOTHROID) 25 MCG tablet Take 25 mcg by mouth daily before breakfast.    . losartan (COZAAR) 25 MG tablet TAKE 1/2 TABLET BY MOUTH EVERY DAY 15 tablet 6  . metoprolol succinate (TOPROL-XL) 25 MG 24 hr tablet TAKE 1 TABLET BY MOUTH DAILY 30 tablet 6  . mirtazapine (REMERON) 15 MG tablet Take 15 mg by mouth at bedtime.    Marland Kitchen warfarin (COUMADIN) 5 MG tablet Take 2.5-5 mg by mouth daily. 5 mg on Tuesday, Thursday, and Sunday and 2.5 mg all the other days of the week. (MANAGED BY VYAS)     No current facility-administered medications for this visit.    Allergies:  Amoxicillin; Angiotensin receptor blockers; Ciprofloxacin; Codeine; Codeine; Demerol [meperidine]; Lisinopril; Meperidine hcl; Morphine; Sulfonamide derivatives; and Nitrofurantoin   Social History: The patient  reports that she quit smoking about 50 years ago. Her smoking use included Cigarettes. She has a 18.00 pack-year smoking history. She has never used smokeless tobacco. She reports that she does not drink alcohol or  use drugs.   ROS:  Please see the history of present illness. Otherwise, complete review of systems is positive for weakness.  All other systems are reviewed and negative.   Physical Exam: VS:  BP (!) 98/58   Pulse (!) 51   Ht 5\' 6"  (1.676 m)   Wt 126 lb 12.8 oz (57.5 kg)   SpO2 98%   BMI 20.47 kg/m , BMI Body mass index is 20.47 kg/m.  Wt Readings from Last 3 Encounters:  09/22/16 126 lb 12.8 oz (57.5 kg)  01/30/16 129 lb 3.2 oz (58.6 kg)  07/22/15 129 lb 3.2 oz (58.6 kg)    General: Frail elderly woman, no distress. Seated in wheelchair. HEENT: Conjunctiva and lids normal, oropharynx clear. Neck:  Supple, no elevated JVP or carotid bruits, no thyromegaly. Lungs: Clear to auscultation, nonlabored breathing at rest. Cardiac: Irregularly irregular, no S3, 2/6 apical systolic murmur, no pericardial rub. Abdomen: Soft, nontender, bowel sounds present, no guarding or rebound. Extremities: No pitting edema, distal pulses 2+.  ECG: I personally reviewed the tracing from 01/30/2016 which showed rate-controlled atrial fibrillation with aberrantly conducted complexes versus PVCs, poor R-wave progression, leftward axis, and low voltage.  Other Studies Reviewed Today:  Echocardiogram 06/22/2012: Study Conclusions  - Left ventricle: Septal dyssynergy. Akinetic inferolateral wall. The cavity size was moderately dilated. Wall thickness was increased in a pattern of mild LVH. The estimated ejection fraction was 30%. Diffuse hypokinesis. - Aortic valve: Sclerosis without stenosis. No significant regurgitation. - Mitral valve: Mobility of the posterior leaflet was mildly restricted. Moderate regurgitation. - Left atrium: The atrium was moderately dilated. - Right ventricle: The cavity size was moderately dilated. Systolic function was mildly to moderately reduced. - Right atrium: The atrium was mildly to moderately dilated. - Tricuspid valve: Moderate regurgitation.  Assessment and Plan:  1. Suspected nonischemic cardiomyopathy, LVEF 30% based on prior assessment, and managed conservatively over time. Plan to continue medical therapy, although with relatively low blood pressure would hold off on Cozaar for now. She will have blood pressure rechecked next week at [facility] and can resume if trend increases as she gets over her UTI.  2. Chronic atrial fibrillation, on Toprol-XL and Coumadin. Keep follow-up with Dr. Sherril Croon for INR.  3. Moderate mitral regurgitation, no change in cardiac murmur. Continue observation.  4. Patient diagnosed with UTI today by PCP, starting  antibiotics.  Current medicines were reviewed with the patient today.  Disposition: Follow-up in 6 months.  Signed, Jonelle Sidle, MD, [facility] 09/22/2016 1:49 PM    [facility] at [facility] [address] [place], [place], [place] [address] Phone: [phone]; Fax: [phone]

## 2016-09-22 NOTE — Patient Instructions (Signed)
Medication Instructions:  Hold the Cozaar (Losartan) till see primary MD for follow up.    Labwork: none  Testing/Procedures: none  Follow-Up: Your physician wants you to follow up in: 6 months.  You will receive a reminder letter in the mail one-two months in advance.  If you don't receive a letter, please call our office to schedule the follow up appointment   Any Other Special Instructions Will Be Listed Below (If Applicable).  If you need a refill on your cardiac medications before your next appointment, please call your pharmacy.

## 2017-06-15 DEATH — deceased
